# Patient Record
Sex: Male | Born: 1960
Health system: Southern US, Community
[De-identification: ages and names within clinical notes are randomized; demographics above are authoritative.]

## PROBLEM LIST (undated history)

## (undated) DIAGNOSIS — F419 Anxiety disorder, unspecified: Secondary | ICD-10-CM

## (undated) DIAGNOSIS — J189 Pneumonia, unspecified organism: Secondary | ICD-10-CM

## (undated) DIAGNOSIS — G4730 Sleep apnea, unspecified: Secondary | ICD-10-CM

## (undated) DIAGNOSIS — E785 Hyperlipidemia, unspecified: Secondary | ICD-10-CM

## (undated) DIAGNOSIS — I2510 Atherosclerotic heart disease of native coronary artery without angina pectoris: Secondary | ICD-10-CM

## (undated) DIAGNOSIS — S62612P Displaced fracture of proximal phalanx of right middle finger, subsequent encounter for fracture with malunion: Secondary | ICD-10-CM

## (undated) DIAGNOSIS — I10 Essential (primary) hypertension: Secondary | ICD-10-CM

## (undated) HISTORY — PX: FLEXIBLE SIGMOIDOSCOPY: SHX1649

## (undated) HISTORY — DX: Hyperlipidemia, unspecified: E78.5

## (undated) HISTORY — DX: Atherosclerotic heart disease of native coronary artery without angina pectoris: I25.10

## (undated) HISTORY — DX: Essential (primary) hypertension: I10

## (undated) HISTORY — PX: CORONARY ANGIOPLASTY WITH STENT PLACEMENT: SHX49

## (undated) HISTORY — DX: Anxiety disorder, unspecified: F41.9

---

## 1995-10-30 DIAGNOSIS — J189 Pneumonia, unspecified organism: Secondary | ICD-10-CM

## 1995-10-30 HISTORY — DX: Pneumonia, unspecified organism: J18.9

## 1995-10-30 HISTORY — PX: BILE DUCT STENT PLACEMENT: SHX1227

## 1995-10-30 HISTORY — PX: LAPAROSCOPIC CHOLECYSTECTOMY: SUR755

## 2006-09-09 ENCOUNTER — Emergency Department (HOSPITAL_COMMUNITY): Admission: EM | Admit: 2006-09-09 | Discharge: 2006-09-09 | Payer: Self-pay | Admitting: Emergency Medicine

## 2006-09-09 ENCOUNTER — Ambulatory Visit: Payer: Self-pay | Admitting: Cardiovascular Disease

## 2006-09-16 ENCOUNTER — Ambulatory Visit: Payer: Self-pay | Admitting: Cardiovascular Disease

## 2008-02-19 ENCOUNTER — Ambulatory Visit (HOSPITAL_COMMUNITY): Admission: RE | Admit: 2008-02-19 | Discharge: 2008-02-19 | Payer: Self-pay | Admitting: Family Medicine

## 2008-07-13 ENCOUNTER — Ambulatory Visit: Payer: Self-pay | Admitting: Cardiovascular Disease

## 2008-07-21 ENCOUNTER — Ambulatory Visit: Payer: Self-pay

## 2008-08-12 ENCOUNTER — Ambulatory Visit: Payer: Self-pay | Admitting: Cardiovascular Disease

## 2010-02-08 ENCOUNTER — Encounter: Payer: Self-pay | Admitting: Cardiovascular Disease

## 2010-02-08 ENCOUNTER — Ambulatory Visit (HOSPITAL_COMMUNITY): Admission: RE | Admit: 2010-02-08 | Discharge: 2010-02-08 | Payer: Self-pay | Admitting: Family Medicine

## 2010-02-09 ENCOUNTER — Ambulatory Visit: Payer: Self-pay | Admitting: Cardiovascular Disease

## 2010-02-09 ENCOUNTER — Encounter (INDEPENDENT_AMBULATORY_CARE_PROVIDER_SITE_OTHER): Payer: Self-pay | Admitting: *Deleted

## 2010-02-09 ENCOUNTER — Encounter: Payer: Self-pay | Admitting: Cardiovascular Disease

## 2010-02-09 DIAGNOSIS — R079 Chest pain, unspecified: Secondary | ICD-10-CM | POA: Insufficient documentation

## 2010-02-09 DIAGNOSIS — I10 Essential (primary) hypertension: Secondary | ICD-10-CM | POA: Insufficient documentation

## 2010-02-09 DIAGNOSIS — E785 Hyperlipidemia, unspecified: Secondary | ICD-10-CM | POA: Insufficient documentation

## 2010-02-09 DIAGNOSIS — R0602 Shortness of breath: Secondary | ICD-10-CM | POA: Insufficient documentation

## 2010-02-10 ENCOUNTER — Ambulatory Visit: Payer: Self-pay | Admitting: Cardiovascular Disease

## 2010-02-10 ENCOUNTER — Ambulatory Visit (HOSPITAL_COMMUNITY): Admission: RE | Admit: 2010-02-10 | Discharge: 2010-02-11 | Payer: Self-pay | Admitting: Cardiovascular Disease

## 2010-02-10 LAB — CONVERTED CEMR LAB
BUN: 13 mg/dL (ref 6–23)
Calcium: 9.7 mg/dL (ref 8.4–10.5)
Creatinine, Ser: 1.15 mg/dL (ref 0.40–1.50)
Eosinophils Absolute: 0.1 10*3/uL (ref 0.0–0.7)
Eosinophils Relative: 1 % (ref 0–5)
Glucose, Bld: 93 mg/dL (ref 70–99)
HCT: 43.1 % (ref 39.0–52.0)
INR: 0.96 (ref <1.50)
Lymphocytes Relative: 25 % (ref 12–46)
MCHC: 35.5 g/dL (ref 30.0–36.0)
Platelets: 213 10*3/uL (ref 150–400)
RBC: 4.71 M/uL (ref 4.22–5.81)
RDW: 11.9 % (ref 11.5–15.5)
Sodium: 139 meq/L (ref 135–145)
WBC: 6.6 10*3/uL (ref 4.0–10.5)
aPTT: 25 s (ref 24–37)

## 2010-02-28 ENCOUNTER — Observation Stay (HOSPITAL_COMMUNITY): Admission: EM | Admit: 2010-02-28 | Discharge: 2010-03-01 | Payer: Self-pay | Admitting: Emergency Medicine

## 2010-02-28 ENCOUNTER — Telehealth: Payer: Self-pay | Admitting: Cardiovascular Disease

## 2010-02-28 ENCOUNTER — Ambulatory Visit: Payer: Self-pay | Admitting: Cardiology

## 2010-03-08 ENCOUNTER — Ambulatory Visit: Payer: Self-pay | Admitting: Cardiovascular Disease

## 2010-06-26 ENCOUNTER — Telehealth: Payer: Self-pay | Admitting: Cardiovascular Disease

## 2010-08-30 ENCOUNTER — Ambulatory Visit: Payer: Self-pay | Admitting: Cardiovascular Disease

## 2010-10-02 ENCOUNTER — Encounter: Payer: Self-pay | Admitting: Cardiovascular Disease

## 2010-10-27 ENCOUNTER — Encounter: Payer: Self-pay | Admitting: Cardiovascular Disease

## 2010-10-27 ENCOUNTER — Ambulatory Visit: Payer: Self-pay | Admitting: Cardiovascular Disease

## 2010-11-19 ENCOUNTER — Emergency Department (HOSPITAL_COMMUNITY)
Admission: EM | Admit: 2010-11-19 | Discharge: 2010-11-19 | Payer: Self-pay | Source: Home / Self Care | Admitting: Emergency Medicine

## 2010-11-19 ENCOUNTER — Encounter: Payer: Self-pay | Admitting: Orthopedic Surgery

## 2010-11-20 ENCOUNTER — Ambulatory Visit
Admission: RE | Admit: 2010-11-20 | Discharge: 2010-11-20 | Payer: Self-pay | Source: Home / Self Care | Attending: Orthopedic Surgery | Admitting: Orthopedic Surgery

## 2010-11-20 ENCOUNTER — Encounter: Payer: Self-pay | Admitting: Orthopedic Surgery

## 2010-11-20 DIAGNOSIS — S62319A Displaced fracture of base of unspecified metacarpal bone, initial encounter for closed fracture: Secondary | ICD-10-CM | POA: Insufficient documentation

## 2010-11-26 LAB — CONVERTED CEMR LAB
CO2: 23 meq/L (ref 19–32)
Creatinine, Ser: 1.08 mg/dL (ref 0.40–1.50)
Glucose, Bld: 88 mg/dL (ref 70–99)
Potassium: 4 meq/L (ref 3.5–5.3)

## 2010-11-28 NOTE — Progress Notes (Signed)
Summary: refill  Phone Note Refill Request Message from:  Patient on June 26, 2010 3:16 PM  Refills Requested: Medication #1:  EFFIENT 10 MG TABS 1 tab by mouth once daily.  Medication #2:  METOPROLOL TARTRATE 25 MG TABS Take one tablet by mouth twice a day [facility]  Initial call taken by: Judie Grieve,  June 26, 2010 3:16 PM    Prescriptions: EFFIENT 10 MG TABS (PRASUGREL HCL) 1 tab by mouth once daily  #90 x 3   Entered by:   Kem Parkinson   Authorized by:   Colon Branch, MD, [facility]   Signed by:   Kem Parkinson on 06/26/2010   Method used:   Electronically to        [facility]* (retail)             ,          Ph: [phone]       Fax: [phone]   RxID:   [id number] METOPROLOL TARTRATE 25 MG TABS (METOPROLOL TARTRATE) Take one tablet by mouth twice a day  #180 x 3   Entered by:   Kem Parkinson   Authorized by:   Colon Branch, MD, [facility]   Signed by:   Kem Parkinson on 06/26/2010   Method used:   Electronically to        [facility]* (retail)             ,          Ph: [phone]       Fax: [phone]   RxID:   [id number]

## 2010-11-28 NOTE — Assessment & Plan Note (Signed)
Summary: per check out/sf   History of Present Illness: Jeff Prince is seen post hosp D/C  He had a DES to the prox LAD about 3 weeks ago.  He was readmitted with SSCP about a week after implantation and F/U cath showed a patent stent.  He has a significatn panic/anxiety disorder that needs further Rx.  I encouraged him to discuss referall  by Dr Regino Schultze.  He denies SSCP since 2nd D/C.  He has been compliant with his meds and needs his Tricor refilled through [facility].  He is on Effient and not having any bleeding problems.  All of his caths were done from the right radial with no complicaitons.  BP has been running a little high I would rather have him on ACE and Ca blocker and we will switch him over to Cozaar  Current Problems (verified): 1)  Essential Hypertension, Benign  (ICD-401.1) 2)  Chest Pain  (ICD-786.50) 3)  Dyspnea  (ICD-786.05) 4)  Hyperlipidemia  (ICD-272.4)  Current Medications (verified): 1)  Tricor 145 Mg Tabs (Fenofibrate) .... Take 1 Tab Daily 2)  Aspirin 81 Mg Tbec (Aspirin) .... Take One Tablet By Mouth Daily 3)  Daily Multi  Tabs (Multiple Vitamins-Minerals) .... Take 1 Tab Daily 4)  Vitamin E 100 Unit Caps (Vitamin E) .... Take 1 Tab Daily 5)  Vitamin C Cr 500 Mg Cr-Tabs (Ascorbic Acid) .... Take 1 Tab Daily 6)  Ra Fish Oil 1000 Mg Caps (Omega-3 Fatty Acids) .... Take 3 Caps Daily 7)  Simvastatin 20 Mg Tabs (Simvastatin) .... Take One Tablet By Mouth Daily At Bedtime 8)  Metoprolol Tartrate 25 Mg Tabs (Metoprolol Tartrate) .... Take One Tablet By Mouth Twice A Day 9)  Effient 10 Mg Tabs (Prasugrel Hcl) .Marland Kitchen.. 1 Tab By Mouth Once Daily  Allergies (verified): No Known Drug Allergies  Past History:  Past Medical History: Last updated: Mar 04, 2010 Current Problems:  CHEST PAIN (ICD-786.50) DYSPNEA (ICD-786.05) HYPERLIPIDEMIA (ICD-272.4)  Past Surgical History: Last updated: 03-04-10 Laparscopic cholecystectomy 1997  Family History: Last updated:  03-04-2010 Father:deceased age 24 lung cancer Mother:age 20 alive with diabetes Siblings:1 sister 2 brothers alive and well all have elevated triglycerides.  Social History: Last updated: 2010/03/04 Full Time Married  Tobacco Use - Yes.  Alcohol Use - yes Regular Exercise - no Drug Use - no  Review of Systems       Denies fever, malais, weight loss, blurry vision, decreased visual acuity, cough, sputum, SOB, hemoptysis, pleuritic pain, palpitaitons, heartburn, abdominal pain, melena, lower extremity edema, claudication, or rash.   Vital Signs:  Patient profile:   50 year old male Height:      72 inches Weight:      180 pounds BMI:     24.50 Pulse rate:   60 / minute Resp:     14 per minute BP sitting:   160 / 82  (left arm)  Vitals Entered By: Kem Parkinson (August 30, 2010 10:02 AM)  Physical Exam  General:  Affect appropriate Healthy:  appears stated age HEENT: normal Neck supple with no adenopathy JVP normal no bruits no thyromegaly Lungs clear with no wheezing and good diaphragmatic motion Heart:  S1/S2 no murmur,rub, gallop or click PMI normal Abdomen: benighn, BS positve, no tenderness, no AAA no bruit.  No HSM or HJR Distal pulses intact with no bruits No edema Neuro non-focal Skin warm and dry    Impression & Recommendations:  Problem # 1:  ESSENTIAL HYPERTENSION, BENIGN (ICD-401.1) Change to Cozaar Check  BMET in 4 weeks The following medications were removed from the medication list:    Norvasc 5 Mg Tabs (Amlodipine besylate) .Marland Kitchen... 1 tab by mouth once daily His updated medication list for this problem includes:    Aspirin 81 Mg Tbec (Aspirin) .Marland Kitchen... Take one tablet by mouth daily    Metoprolol Tartrate 25 Mg Tabs (Metoprolol tartrate) .Marland Kitchen... Take one tablet by mouth twice a day    Cozaar 50 Mg Tabs (Losartan potassium) .Marland Kitchen... 1qd  Problem # 2:  CHEST PAIN (ICD-786.50) CAD with stent LAD.  Recath patent  Anxiety component.  Continue medical  Rx The following medications were removed from the medication list:    Norvasc 5 Mg Tabs (Amlodipine besylate) .Marland Kitchen... 1 tab by mouth once daily His updated medication list for this problem includes:    Aspirin 81 Mg Tbec (Aspirin) .Marland Kitchen... Take one tablet by mouth daily    Metoprolol Tartrate 25 Mg Tabs (Metoprolol tartrate) .Marland Kitchen... Take one tablet by mouth twice a day    Effient 10 Mg Tabs (Prasugrel hcl) .Marland Kitchen... 1 tab by mouth once daily  Problem # 3:  HYPERLIPIDEMIA (ICD-272.4) Contineu 2 drug Rx labs in 3 months His updated medication list for this problem includes:    Tricor 145 Mg Tabs (Fenofibrate) .Marland Kitchen... Take 1 tab daily    Simvastatin 20 Mg Tabs (Simvastatin) .Marland Kitchen... Take one tablet by mouth daily at bedtime  Patient Instructions: 1)  Your physician recommends that you schedule a follow-up appointment in: 6-8 WEEKS WITH DR Eden Emms IN [place] IF POSSILBE 2)  Your physician recommends that you return for lab work in:4 WEEKS BMET V58.69 3)  Your physician has recommended you make the following change in your medication: STOP NORVASC(AMLODIPINE) 4)  START COZAAR 50 MG 1 once daily  Prescriptions: COZAAR 50 MG TABS (LOSARTAN POTASSIUM) 1QD  #7 x 0   Entered by:   Scherrie Bateman, LPN   Authorized by:   Colon Branch, MD, [facility]   Signed by:   Scherrie Bateman, LPN on 65/78/4696   Method used:   Electronically to        [facility]* (retail)       [address] [place], [place]  [address]       Ph: [phone]       Fax: [phone]   RxID:   [id number] COZAAR 50 MG TABS (LOSARTAN POTASSIUM) 1QD  #90 x 3   Entered by:   Scherrie Bateman, LPN   Authorized by:   Colon Branch, MD, [facility]   Signed by:   Scherrie Bateman, LPN on 29/51/8841   Method used:   Electronically to        [facility]* (retail)             ,          Ph: [phone]       Fax: [phone]   RxID:   [id number]

## 2010-11-28 NOTE — Miscellaneous (Signed)
Summary: Orders Update  Clinical Lists Changes  Orders: Added new Test order of T-Basic Metabolic Panel (80048-22910) - Signed Added new Test order of T-CBC w/Diff (85025-10010) - Signed Added new Test order of T-Protime, Auto (85610-22000) - Signed 

## 2010-11-28 NOTE — Progress Notes (Signed)
Summary: chest pains this morning burning in chest med making pt sick  Phone Note Call from Patient Call back at Home Phone [phone]   Caller: Mom Summary of Call: Pt had a stent placed about two weeks ago and pt is still having chest pains,  burning feelingin chest  and the mediction is making the pt sick  Initial call taken by: Judie Grieve,  Feb 28, 2010 9:02 AM  Follow-up for Phone Call        Patient had a cardiac cath with stent a couple of weeks ago.  Said he didn't have any symptoms the first week after the procedure.  Now, patient described having chest pain & L arm pain, a burning sensation in L chest, all for about the last week.   Also, he said he's been having severe anxiety and last night had pain in his jaw & neck for about an hour.  It eased up but didn't go away.  He said it was there for most of the night and he even had to get up during the night a couple of times because of it. Told him to go to the ED for evaluation.  He verbalized understanding.  Called cardmaster to alert her of his arrival. Follow-up by: Minerva Areola, RN, BSN,  Feb 28, 2010 9:18 AM

## 2010-11-28 NOTE — Letter (Signed)
Summary: Cardiac Catheterization Instructions- Main Lab  [facility], Main Office  [address]   [place], [place] [address]   Phone: [phone]  Fax: [phone]     02/09/2010 MRN: [id number]  Jeff Prince [address] [place], [place]  [address]  Dear Jeff Prince,   You are scheduled for Cardiac Catheterization on FRIDAY 02-10-10              with Dr. Eden Emms           .  Please arrive at the [facility] at  7:30     a.m. on the day of your procedure.  1. DIET     XXXXXXXXX____ Nothing to eat or drink after midnight except your medications with a sip of water.  2. Come to the [place] office on             for lab work.  The lab at [facility] is open from 8:30 a.m. to 1:30 p.m. and 2:30 p.m. to 5:00 p.m.  The lab at [facility] is open from 7:30 a.m. to 5:30 p.m.  You do not have to be fasting.  3. MAKE SURE YOU TAKE YOUR ASPIRIN.  4. _____ DO NOT TAKE these medications before your procedure:         ________________________________________________________________________________      ____ YOU MAY TAKE ALL of your remaining medications with a small amount of water.      ____ START NEW medications:     ________________________________________________________________________________      ____ Eilene Ghazi instructions:     ________________________________________________________________________________  5. Plan for one night stay - bring personal belongings (i.e. toothpaste, toothbrush, etc.)  6. Bring a current list of your medications and current insurance cards.  7. Must have a responsible person to drive you home.   8. Someone must be with yu for the first 24 hours after you arrive home.  9. Please wear clothes that are easy to get on and off and wear slip-on shoes.  *Special note: Every effort is made to have your procedure done on time.  Occasionally there are emergencies that present themselves  at the hospital that may cause delays.  Please be patient if a delay does occur.  If you have any questions after you get home, please call the office at the number listed above.  Deliah Goody, RN

## 2010-11-28 NOTE — Cardiovascular Report (Signed)
Summary: Pre Cath/Percutaneous Orders  Pre Cath/Percutaneous Orders   Imported By: Roderic Ovens 02/16/2010 10:46:15  _____________________________________________________________________  External Attachment:    Type:   Image     Comment:   External Document

## 2010-11-28 NOTE — Assessment & Plan Note (Signed)
Summary: 1 MONTH ROV   History of Present Illness: Jeff Prince is seen post hosp D/C  He had a DES to the prox LAD about 3 weeks ago.  He was readmitted with SSCP about a week after implantation and F/U cath showed a patent stent.  He has a significatn panic/anxiety disorder that needs further Rx.  I encouraged him to discuss referall  by Dr Regino Schultze.  He denies SSCP since 2nd D/C.  He has been compliant with his meds and needs his Tricor refilled through [facility].  He is on Effient and not having any bleeding problems.  All of his caths were done from the right radial with no complicaitons.  Current Problems (verified): 1)  Essential Hypertension, Benign  (ICD-401.1) 2)  Chest Pain  (ICD-786.50) 3)  Dyspnea  (ICD-786.05) 4)  Hyperlipidemia  (ICD-272.4)  Current Medications (verified): 1)  Tricor 145 Mg Tabs (Fenofibrate) .... Take 1 Tab Daily 2)  Aspirin 81 Mg Tbec (Aspirin) .... Take One Tablet By Mouth Daily 3)  Daily Multi  Tabs (Multiple Vitamins-Minerals) .... Take 1 Tab Daily 4)  Vitamin E 100 Unit Caps (Vitamin E) .... Take 1 Tab Daily 5)  Vitamin C Cr 500 Mg Cr-Tabs (Ascorbic Acid) .... Take 1 Tab Daily 6)  Ra Fish Oil 1000 Mg Caps (Omega-3 Fatty Acids) .... Take2 Caps Daily 7)  Simvastatin 20 Mg Tabs (Simvastatin) .... Take One Tablet By Mouth Daily At Bedtime 8)  Norvasc 5 Mg Tabs (Amlodipine Besylate) .Marland Kitchen.. 1 Tab By Mouth Once Daily 9)  Metoprolol Tartrate 25 Mg Tabs (Metoprolol Tartrate) .... Take One Tablet By Mouth Twice A Day 10)  Effient 10 Mg Tabs (Prasugrel Hcl) .Marland Kitchen.. 1 Tab By Mouth Once Daily  Allergies (verified): No Known Drug Allergies  Past History:  Past Medical History: Last updated: 15-Feb-2010 Current Problems:  CHEST PAIN (ICD-786.50) DYSPNEA (ICD-786.05) HYPERLIPIDEMIA (ICD-272.4)  Past Surgical History: Last updated: 02/15/10 Laparscopic cholecystectomy 1997  Family History: Last updated: Feb 15, 2010 Father:deceased age 91 lung cancer Mother:age 55 alive  with diabetes Siblings:1 sister 2 brothers alive and well all have elevated triglycerides.  Social History: Last updated: 02/15/10 Full Time Married  Tobacco Use - Yes.  Alcohol Use - yes Regular Exercise - no Drug Use - no  Review of Systems       Denies fever, malais, weight loss, blurry vision, decreased visual acuity, cough, sputum, SOB, hemoptysis, pleuritic pain, palpitaitons, heartburn, abdominal pain, melena, lower extremity edema, claudication, or rash.   Vital Signs:  Patient profile:   50 year old male Height:      72 inches Weight:      180 pounds BMI:     24.50 Pulse rate:   54 / minute Resp:     14 per minute BP sitting:   141 / 86  (left arm)  Vitals Entered By: Kem Parkinson (Mar 08, 2010 9:14 AM)  Physical Exam  General:  Affect appropriate Healthy:  appears stated age HEENT: normal Neck supple with no adenopathy JVP normal no bruits no thyromegaly Lungs clear with no wheezing and good diaphragmatic motion Heart:  S1/S2 no murmur,rub, gallop or click PMI normal Abdomen: benighn, BS positve, no tenderness, no AAA no bruit.  No HSM or HJR Distal pulses intact with no bruits No edema Neuro non-focal Skin warm and dry Right radial patent    Impression & Recommendations:  Problem # 1:  ESSENTIAL HYPERTENSION, BENIGN (ICD-401.1) Well controlled continue norvasc His updated medication list for this problem includes:  Aspirin 81 Mg Tbec (Aspirin) .Marland Kitchen... Take one tablet by mouth daily    Norvasc 5 Mg Tabs (Amlodipine besylate) .Marland Kitchen... 1 tab by mouth once daily    Metoprolol Tartrate 25 Mg Tabs (Metoprolol tartrate) .Marland Kitchen... Take one tablet by mouth twice a day  Problem # 2:  CHEST PAIN (ICD-786.50) Stent to proximal LAD patent on relook.  Continue a Effient His updated medication list for this problem includes:    Aspirin 81 Mg Tbec (Aspirin) .Marland Kitchen... Take one tablet by mouth daily    Norvasc 5 Mg Tabs (Amlodipine besylate) .Marland Kitchen... 1 tab by mouth once  daily    Metoprolol Tartrate 25 Mg Tabs (Metoprolol tartrate) .Marland Kitchen... Take one tablet by mouth twice a day    Effient 10 Mg Tabs (Prasugrel hcl) .Marland Kitchen... 1 tab by mouth once daily  Problem # 3:  HYPERLIPIDEMIA (ICD-272.4) F/U labs in 6 months  Refill called in His updated medication list for this problem includes:    Tricor 145 Mg Tabs (Fenofibrate) .Marland Kitchen... Take 1 tab daily    Simvastatin 20 Mg Tabs (Simvastatin) .Marland Kitchen... Take one tablet by mouth daily at bedtime  Patient Instructions: 1)  Your physician recommends that you schedule a follow-up appointment in: 6 MONTHS WITH DR. Eden Emms IN [place]  2)  Your physician recommends that you continue on your current medications as directed. Please refer to the Current Medication list given to you today.

## 2010-11-28 NOTE — Assessment & Plan Note (Signed)
Summary: np3/dm   CC:  pt complains of chest pain , pt states his arm gets numb and fingers tingle, and pt states he notices the pain when his heart rate is up. Severity of pain 7 .  History of Present Illness: Jeff Prince is seen today as an urgent add-on per Dr Regino Schultze.  He was supposed to see Dr Dietrich Pates in [place] but drove to [place].  I have seen him for SSCP in 2009 and he had a normal myovue.  He has some anxiety and carries xanax with him.  In the last two weeks he's had worrisome SSCP with exertion.  It is always related to elevated heart rate, and radiates to the left arm with tingling.  When he stops exercising and his HR goes done his pain eases.  He has not had any rest episodes.  ECG at Dr Edison Simon office and here is normal.  He had another bad episode on Tuesday having sex with his wife and had to stop.  His CRF's include elevated lipids and HTN.  He does not appear particularly anxious and is working full time at [employer].  Given his crescendo pattern of anginal sounding pain I recommended a heart cath in the morning to him  The risks including stroke were discussed  The alternative approach from the wrist was also discussed and his Allens test in the right wrist was normal.  He will have labs and CXR today.  I prefer to load him with Effient in the lab if he has disease.  Current Problems (verified): 1)  Chest Pain  (ICD-786.50) 2)  Dyspnea  (ICD-786.05) 3)  Hyperlipidemia  (ICD-272.4)  Current Medications (verified): 1)  Tricor 145 Mg Tabs (Fenofibrate) .... Take 1 Tab Daily 2)  Aspirin 81 Mg Tbec (Aspirin) .... Take One Tablet By Mouth Daily 3)  Daily Multi  Tabs (Multiple Vitamins-Minerals) .... Take 1 Tab Daily 4)  Vitamin E 100 Unit Caps (Vitamin E) .... Take 1 Tab Daily 5)  Vitamin C Cr 500 Mg Cr-Tabs (Ascorbic Acid) .... Take 1 Tab Daily 6)  Ra Fish Oil 1000 Mg Caps (Omega-3 Fatty Acids) .... Take2 Caps Daily 7)  Simvastatin 20 Mg Tabs (Simvastatin) .... Take One Tablet By  Mouth Daily At Bedtime 8)  Norvasc .Marland Kitchen.. 1 Tab Po Once Daily  Allergies (verified): No Known Drug Allergies  Past History:  Past Medical History: Last updated: Feb 11, 2010 Current Problems:  CHEST PAIN (ICD-786.50) DYSPNEA (ICD-786.05) HYPERLIPIDEMIA (ICD-272.4)  Past Surgical History: Last updated: 02-11-10 Laparscopic cholecystectomy 1997  Family History: Last updated: 02/11/2010 Father:deceased age 74 lung cancer Mother:age 39 alive with diabetes Siblings:1 sister 2 brothers alive and well all have elevated triglycerides.  Social History: Last updated: 2010-02-11 Full Time Married  Tobacco Use - Yes.  Alcohol Use - yes Regular Exercise - no Drug Use - no  Review of Systems       Denies fever, malais, weight loss, blurry vision, decreased visual acuity, cough, sputum, SOB, hemoptysis, pleuritic pain, palpitaitons, heartburn, abdominal pain, melena, lower extremity edema, claudication, or rash.   Vital Signs:  Patient profile:   50 year old male Height:      72 inches Weight:      180 pounds BMI:     24.50 Pulse rate:   73 / minute Resp:     14 per minute BP sitting:   134 / 100  (left arm)  Vitals Entered By: Kem Parkinson (02/11/10 3:33 PM)  Physical Exam  General:  Affect appropriate Healthy:  appears stated age HEENT: normal Neck supple with no adenopathy JVP normal no bruits no thyromegaly Lungs clear with no wheezing and good diaphragmatic motion Heart:  S1/S2 no murmur,rub, gallop or click PMI normal Abdomen: benighn, BS positve, no tenderness, no AAA no bruit.  No HSM or HJR Distal pulses intact with no bruits No edema Neuro non-focal Skin warm and dry Mild pain to palpation chest but not shoulder or arm   Impression & Recommendations:  Problem # 1:  CHEST PAIN (ICD-786.50) Worisome for angina Cath in a.m. CXR normal and labs reviewed and normal His updated medication list for this problem includes:    Aspirin 81 Mg Tbec  (Aspirin) .Marland Kitchen... Take one tablet by mouth daily  Orders: T-2 View CXR (71020TC) Cardiac Catheterization (Cardiac Cath)  Problem # 2:  HYPERLIPIDEMIA (ICD-272.4) Labs per Dr Regino Schultze.  Target LDL will depend on presence or absence of CAD on cath His updated medication list for this problem includes:    Tricor 145 Mg Tabs (Fenofibrate) .Marland Kitchen... Take 1 tab daily    Simvastatin 20 Mg Tabs (Simvastatin) .Marland Kitchen... Take one tablet by mouth daily at bedtime  Problem # 3:  ESSENTIAL HYPERTENSION, BENIGN (ICD-401.1) Well controlled.  Low sodium diet His updated medication list for this problem includes:    Aspirin 81 Mg Tbec (Aspirin) .Marland Kitchen... Take one tablet by mouth daily  Patient Instructions: 1)  Your physician recommends that you schedule a follow-up appointment in: 4 WEEKS 2)  Your physician has requested that you have a cardiac catheterization.  Cardiac catheterization is used to diagnose and/or treat various heart conditions. Doctors may recommend this procedure for a number of different reasons. The most common reason is to evaluate chest pain. Chest pain can be a symptom of coronary artery disease (CAD), and cardiac catheterization can show whether plaque is narrowing or blocking your heart's arteries. This procedure is also used to evaluate the valves, as well as measure the blood flow and oxygen levels in different parts of your heart.  For further information please visit [email or web address]  Please follow instruction sheet, as given.   EKG Report  Procedure date:  02/09/2010  Findings:      NSR 73 Normal ECG

## 2010-11-28 NOTE — Letter (Signed)
Summary: [facility] Office Note  [facility] Office Note   Imported By: Roderic Ovens 03/01/2010 11:38:00  _____________________________________________________________________  External Attachment:    Type:   Image     Comment:   External Document

## 2010-11-30 NOTE — Assessment & Plan Note (Signed)
Summary: AP ER FOL/UP/FX RT METACARPAL/XR AP 11/19/10/BCBS/CAF   Vital Signs:  Patient profile:   50 year old male Height:      72 inches Weight:      180 pounds Pulse rate:   78 / minute Resp:     16 per minute  Vitals Entered By: Fuller Canada MD (November 20, 2010 3:01 PM)  Visit Type:  new patient Referring Provider:  ap er Primary Provider:  Dr. Regino Schultze  CC:  right hand.  History of Present Illness: I saw Jeff Prince in the office today for an initial visit.  He is a 50 years old man with the complaint of:  right hand fracture.  DOI 11/18/10.  Xrays [facility] 11/19/10.  Meds: Tricor, Zocor, ASA, Effient, Losartan, Norco 5 given for pain from er, number 20.  Complaint  RIGHT hand fracture.  Pain is described as throbbing, pain on a  scale of 7/10, constant, unrelieved by Norco and splinting. He is on Norco 5 mg.  Patient injured his hand when his RIGHT hand slipped off a Tuli at the back of a hard object and has a minimally displaced minimally angulated fracture of the base of the metacarpal of his hand.  also has quite a bit of dorsal bruising and swelling.      Allergies (verified): No Known Drug Allergies  Past History:  Past Medical History: Current Problems:  CHEST PAIN (ICD-786.50) DYSPNEA (ICD-786.05) HYPERLIPIDEMIA (ICD-272.4) HTN  Past Surgical History: Laparscopic cholecystectomy 1997 stent in heart  Social History: Full Time textiles Married  Tobacco Use - no Alcohol Use - yes Regular Exercise - no Drug Use - no some college  Review of Systems Constitutional:  Denies weight loss, weight gain, fever, chills, and fatigue. Cardiovascular:  Denies chest pain, palpitations, fainting, and murmurs. Respiratory:  Denies short of breath, wheezing, couch, tightness, pain on inspiration, and snoring . Gastrointestinal:  Denies heartburn, nausea, vomiting, diarrhea, constipation, and blood in your stools. Genitourinary:  Denies frequency, urgency,  difficulty urinating, painful urination, flank pain, and bleeding in urine. Neurologic:  Denies numbness, tingling, unsteady gait, dizziness, tremors, and seizure. Musculoskeletal:  Denies joint pain, swelling, instability, stiffness, redness, heat, and muscle pain. Endocrine:  Denies excessive thirst, exessive urination, and heat or cold intolerance. Psychiatric:  Complains of anxiety; denies nervousness, depression, and hallucinations. Skin:  Denies changes in the skin, poor healing, rash, itching, and redness. HEENT:  Denies blurred or double vision, eye pain, redness, and watering. Immunology:  Denies seasonal allergies, sinus problems, and allergic to bee stings. Hemoatologic:  Complains of easy bleeding and brusing.  Physical Exam  Additional Exam:  GEN: well developed, well nourished, normal grooming and hygiene, no deformity and normal body habitus.   CDV: pulses are normal, no edema, no erythema. no tenderness  Lymph: normal lymph nodes   Skin: no rashes, skin lesions or open sores   NEURO: normal coordination, reflexes, sensation.   Psyche: awake, alert and oriented. Mood normal   Gait: normal  Right-hand dorsal swelling and ecchymosis of the subcutaneous tissue. No rotatory malalignment noted. Passive range of motion painful.  Muscle tone normal.  Hand and wrist joint are stable     Impression & Recommendations:  Problem # 1:  CLOSED FRACTURE OF BASE OF OTHER METACARPAL BONE (ICD-815.02) Assessment New  The x-rays were done at [facility]. The report and the films have been reviewed. Metacarpal fracture, RIGHT hand, minimally angulated minimally displaced  Orders: New Patient Level III (84696) Metacarpal Fx (29528)  Medications Added to Medication List This Visit: 1)  Norco 7.5-325 Mg Tabs (Hydrocodone-acetaminophen) .Marland Kitchen.. 1 by mouth q 4 as needed pain  Patient Instructions: 1)  WED or THURS FEB 15/16 XR OOP  2)  Please do not get the cast wet. It will casue a  severe skin reaction. If you do get it wet, dry it with a hairdryer on a low setting and call the office. [the cast will need to be changed]  Prescriptions: NORCO 7.5-325 MG TABS (HYDROCODONE-ACETAMINOPHEN) 1 by mouth q 4 as needed pain  #84 x 2   Entered and Authorized by:   Fuller Canada MD   Signed by:   Fuller Canada MD on 11/20/2010   Method used:   Print then Give to Patient   RxID:   [id number]    Orders Added: 1)  New Patient Level III [14782] 2)  Metacarpal Fx [26600]

## 2010-11-30 NOTE — Letter (Signed)
Summary: History form  History form   Imported By: Jacklynn Ganong 11/21/2010 13:36:15  _____________________________________________________________________  External Attachment:    Type:   Image     Comment:   External Document

## 2010-11-30 NOTE — Assessment & Plan Note (Signed)
Summary: PER CHECK OUT/SF   CC:  chest pain.  History of Present Illness: Jeff Prince is seen post hosp D/C  He had a DES to the prox LAD about 10/11.  He was readmitted with SSCP about a week after implantation and F/U cath showed a patent stent.  He has a significatn panic/anxiety disorder that needs further Rx.  I encouraged him to discuss referall  by Dr Regino Schultze.  He denies SSCP since 2nd D/C.  He has been compliant with his meds and needs his Tricor refilled through [facility].  He is on Effient and not having any bleeding problems.  All of his caths were done from the right radial with no complicaitons.  BP has been running a little high I  and last visit he was put on Cozaar  Gets occasonal rapid palpitations.  Has not been on BB due to fatigue.  Apparantly TC at work when checked was 199.  History of generalized myalgias and focal left arm soreness.  Discussed increasing Zocor to 40 mg  Current Problems (verified): 1)  Essential Hypertension, Benign  (ICD-401.1) 2)  Chest Pain  (ICD-786.50) 3)  Dyspnea  (ICD-786.05) 4)  Hyperlipidemia  (ICD-272.4)  Current Medications (verified): 1)  Tricor 145 Mg Tabs (Fenofibrate) .... Take 1 Tab Daily 2)  Aspirin 81 Mg Tbec (Aspirin) .... Take One Tablet By Mouth Daily 3)  Daily Multi  Tabs (Multiple Vitamins-Minerals) .... Take 1 Tab Daily 4)  Vitamin E 100 Unit Caps (Vitamin E) .... Take 1 Tab Daily 5)  Vitamin C Cr 500 Mg Cr-Tabs (Ascorbic Acid) .... Take 1 Tab Daily 6)  Ra Fish Oil 1000 Mg Caps (Omega-3 Fatty Acids) .... Take 3 Caps Daily 7)  Simvastatin 20 Mg Tabs (Simvastatin) .... Take One Tablet By Mouth Daily At Bedtime 8)  Lopressor 50 Mg Tabs (Metoprolol Tartrate) .Marland Kitchen.. 1 Tab By Mouth Once Daily 9)  Effient 10 Mg Tabs (Prasugrel Hcl) .Marland Kitchen.. 1 Tab By Mouth Once Daily 10)  Cozaar 50 Mg Tabs (Losartan Potassium) .Marland Kitchen.. 1qd  Allergies (verified): No Known Drug Allergies  Past History:  Past Medical History: Last updated: 2010/03/02 Current  Problems:  CHEST PAIN (ICD-786.50) DYSPNEA (ICD-786.05) HYPERLIPIDEMIA (ICD-272.4)  Past Surgical History: Last updated: 03/02/10 Laparscopic cholecystectomy 1997  Family History: Last updated: 03/02/10 Father:deceased age 74 lung cancer Mother:age 5 alive with diabetes Siblings:1 sister 2 brothers alive and well all have elevated triglycerides.  Social History: Last updated: Mar 02, 2010 Full Time Married  Tobacco Use - Yes.  Alcohol Use - yes Regular Exercise - no Drug Use - no  Review of Systems       Denies fever, malais, weight loss, blurry vision, decreased visual acuity, cough, sputum, SOB, hemoptysis, pleuritic pain, heartburn, abdominal pain, melena, lower extremity edema, claudication, or rash.   Vital Signs:  Patient profile:   50 year old male Height:      72 inches Weight:      181 pounds BMI:     24.64 Pulse rate:   78 / minute Resp:     14 per minute BP sitting:   128 / 85  (left arm)  Vitals Entered By: Kem Parkinson (October 27, 2010 8:55 AM)  Physical Exam  General:  Affect appropriate Healthy:  appears stated age HEENT: normal Neck supple with no adenopathy JVP normal no bruits no thyromegaly Lungs clear with no wheezing and good diaphragmatic motion Heart:  S1/S2 no murmur,rub, gallop or click PMI normal Abdomen: benighn, BS positve, no tenderness, no AAA  no bruit.  No HSM or HJR Distal pulses intact with no bruits No edema Neuro non-focal Skin warm and dry    Impression & Recommendations:  Problem # 1:  ESSENTIAL HYPERTENSION, BENIGN (ICD-401.1) Improved with Cozaar His updated medication list for this problem includes:    Aspirin 81 Mg Tbec (Aspirin) .Marland Kitchen... Take one tablet by mouth daily    Lopressor 50 Mg Tabs (Metoprolol tartrate) .Marland Kitchen... 1 tab by mouth once daily    Cozaar 50 Mg Tabs (Losartan potassium) .Marland Kitchen... 1qd  Problem # 2:  CHEST PAIN (ICD-786.50) S/P stent LAD patent on relook cath.  Continue Effient until at  least 10/12 His updated medication list for this problem includes:    Aspirin 81 Mg Tbec (Aspirin) .Marland Kitchen... Take one tablet by mouth daily    Lopressor 50 Mg Tabs (Metoprolol tartrate) .Marland Kitchen... 1 tab by mouth once daily    Effient 10 Mg Tabs (Prasugrel hcl) .Marland Kitchen... 1 tab by mouth once daily  Problem # 3:  HYPERLIPIDEMIA (ICD-272.4) Increase simvastatin and check labs in 3 months His updated medication list for this problem includes:    Tricor 145 Mg Tabs (Fenofibrate) .Marland Kitchen... Take 1 tab daily    Simvastatin 40 Mg Tabs (Simvastatin) .Marland Kitchen... Take one tablet by mouth daily at bedtime  Patient Instructions: 1)  Your physician recommends that you schedule a follow-up appointment in: 3 MONTHS 2)  Your physician has recommended you make the following change in your medication: INCREASE SIMVASTATIN TO 40MG  DAILY Prescriptions: SIMVASTATIN 40 MG TABS (SIMVASTATIN) Take one tablet by mouth daily at bedtime  #90 x 3   Entered by:   Deliah Goody, RN   Authorized by:   Colon Branch, MD, [facility]   Signed by:   Deliah Goody, RN on 10/27/2010   Method used:   Electronically to        [facility]* (retail)             ,          Ph: [phone]       Fax: [phone]   RxID:   [id number]

## 2010-12-14 ENCOUNTER — Ambulatory Visit (INDEPENDENT_AMBULATORY_CARE_PROVIDER_SITE_OTHER): Payer: BC Managed Care – PPO | Admitting: Orthopedic Surgery

## 2010-12-14 ENCOUNTER — Encounter: Payer: Self-pay | Admitting: Orthopedic Surgery

## 2010-12-14 DIAGNOSIS — S62319A Displaced fracture of base of unspecified metacarpal bone, initial encounter for closed fracture: Secondary | ICD-10-CM

## 2010-12-20 NOTE — Assessment & Plan Note (Signed)
Summary: XR OOP RT HAND/DOI 11/17/10/BCBS/BSF   Visit Type:  Follow-up Referring Provider:  ap er Primary Provider:  Dr. Regino Schultze  CC:  right hand fracture.  History of Present Illness: 50 year old male, status post 4th metacarpal fracture, treated with short arm cast.   Date of injury November 18, 2010  right hand fracture.Problem # 1:  CLOSED FRACTURE OF BASE OF OTHER METACARPAL BONE (ICD-815.02)   4th MTC base  Assessment New  The [facility]-rays were done at [facility]. The report and the films have been reviewed. Metacarpal fracture, RIGHT hand, minimally angulated minimally displaced  Orders: New Patient Level III (16109) Metacarpal Fx (60454)  Medications Added to Medication List This Visit: 1)  Norco 7.5-325 Mg Tabs (Hydrocodone-acetaminophen) .Marland Kitchen.. 1 by mouth q 4 as needed pain  Meds: Tricor, Zocor, ASA, Effient, Losartan, Norco 7.5 mg.  Today xray OOP.  Complaint: none.  Allergies: No Known Drug Allergies  Physical Exam  Additional Exam:  he does have a non-on the back of his hand. However, he has no angulatory or alignment abnormality. His fracture is nontender. His grip strength is a little bit weak and his motion is a little bit less than normal, but this is minimal.     Impression & Recommendations:  Problem # 1:  CLOSED FRACTURE OF BASE OF OTHER METACARPAL BONE (ICD-815.02) Assessment Improved  Orders: Post-Op Check (09811) Hand x-ray, minimum 3 views (91478)  Patient Instructions: 1)  Please schedule a follow-up appointment as needed.   Orders Added: 1)  Post-Op Check [99024] 2)  Hand x-ray, minimum 3 views [73130]  Appended Document: XR OOP RT HAND/DOI 11/17/10/BCBS/BSF Separate and Identifiable X-Ray report      3 views of the RIGHT hand out of plaster.  4th metacarpal fracture at the base of minimal displacement some dorsal angulation callus formation noted.  Impression healed/healing 4th metacarpal fracture

## 2011-01-06 ENCOUNTER — Encounter: Payer: Self-pay | Admitting: Cardiovascular Disease

## 2011-01-16 LAB — CARDIAC PANEL(CRET KIN+CKTOT+MB+TROPI)
CK, MB: 1.4 ng/mL (ref 0.3–4.0)
CK, MB: 1.7 ng/mL (ref 0.3–4.0)
Relative Index: 0.8 (ref 0.0–2.5)
Relative Index: 1 (ref 0.0–2.5)
Total CK: 169 U/L (ref 7–232)
Total CK: 172 U/L (ref 7–232)
Troponin I: 0.01 ng/mL (ref 0.00–0.06)
Troponin I: 0.01 ng/mL (ref 0.00–0.06)

## 2011-01-16 LAB — CBC
MCHC: 35.7 g/dL (ref 30.0–36.0)
MCHC: 35.4 g/dL (ref 30.0–36.0)
MCV: 90.2 fL (ref 78.0–100.0)
Platelets: 218 10*3/uL (ref 150–400)
RBC: 4.53 MIL/uL (ref 4.22–5.81)
RBC: 4.5 MIL/uL (ref 4.22–5.81)

## 2011-01-16 LAB — BASIC METABOLIC PANEL
CO2: 26 mEq/L (ref 19–32)
CO2: 24 mEq/L (ref 19–32)
Calcium: 9.1 mg/dL (ref 8.4–10.5)
Calcium: 9.5 mg/dL (ref 8.4–10.5)
Chloride: 109 mEq/L (ref 96–112)
Creatinine, Ser: 1.23 mg/dL (ref 0.4–1.5)
GFR calc Af Amer: >60 mL/min (ref >60)
GFR calc Af Amer: >60 mL/min (ref >60)
GFR calc non Af Amer: >60 mL/min (ref >60)
GFR calc non Af Amer: >60 mL/min (ref >60)
Potassium: 3.5 mEq/L (ref 3.5–5.1)
Sodium: 137 mEq/L (ref 135–145)

## 2011-01-16 LAB — DIFFERENTIAL
Eosinophils Relative: 1 % (ref 0–5)
Lymphocytes Relative: 26 % (ref 12–46)
Lymphs Abs: 1.5 10*3/uL (ref 0.7–4.0)

## 2011-01-16 LAB — POCT CARDIAC MARKERS
CKMB, poc: 1.3 ng/mL (ref 1.0–8.0)
Troponin i, poc: <0.05 ng/mL (ref 0.00–0.09)
Troponin i, poc: <0.05 ng/mL (ref 0.00–0.09)

## 2011-01-16 LAB — POCT I-STAT, CHEM 8
Calcium, Ion: 1.21 mmol/L (ref 1.12–1.32)
Glucose, Bld: 106 mg/dL — ABNORMAL HIGH (ref 70–99)
Potassium: 4.4 mEq/L (ref 3.5–5.1)
Sodium: 140 mEq/L (ref 135–145)
TCO2: 28 mmol/L (ref 0–100)

## 2011-01-16 LAB — PROTIME-INR: Prothrombin Time: 12.8 seconds (ref 11.6–15.2)

## 2011-01-22 ENCOUNTER — Encounter: Payer: Self-pay | Admitting: Cardiovascular Disease

## 2011-01-22 ENCOUNTER — Ambulatory Visit (INDEPENDENT_AMBULATORY_CARE_PROVIDER_SITE_OTHER): Payer: BC Managed Care – PPO | Admitting: Cardiovascular Disease

## 2011-01-22 DIAGNOSIS — E785 Hyperlipidemia, unspecified: Secondary | ICD-10-CM

## 2011-01-22 DIAGNOSIS — R079 Chest pain, unspecified: Secondary | ICD-10-CM

## 2011-01-22 DIAGNOSIS — I2510 Atherosclerotic heart disease of native coronary artery without angina pectoris: Secondary | ICD-10-CM

## 2011-01-22 DIAGNOSIS — I10 Essential (primary) hypertension: Secondary | ICD-10-CM

## 2011-01-22 NOTE — Progress Notes (Signed)
Jeff Prince is seen post hosp D/C  He had a DES to the prox LAD about 10/11.  He was readmitted with SSCP about a week after implantation and F/U cath showed a patent stent.  He has a significatn panic/anxiety disorder that needs further Rx.  I encouraged him to discuss referall  by Dr Regino Schultze.  He denies SSCP since 2nd D/C.  He has been compliant with his meds and needs his Tricor refilled through [facility].  He is on Effient and not having any bleeding problems.  All of his caths were done from the right radial with no complicaitons.  BP has been running a little high I  and last visit he was put on Cozaar  Gets occasonal rapid palpitations.  Has not been on BB due to fatigue.  Apparantly TC at work when checked was 199.  History of generalized myalgias and focal left arm soreness.    ROS: Denies fever, malais, weight loss, blurry vision, decreased visual acuity, cough, sputum, SOB, hemoptysis, pleuritic pain, palpitaitons, heartburn, abdominal pain, melena, lower extremity edema, claudication, or rash.   General: Affect appropriate Healthy:  appears stated age HEENT: normal Neck supple with no adenopathy JVP normal no bruits no thyromegaly Lungs clear with no wheezing and good diaphragmatic motion Heart:  S1/S2 no murmur,rub, gallop or click PMI normal Abdomen: benighn, BS positve, no tenderness, no AAA no bruit.  No HSM or HJR Distal pulses intact with no bruits No edema Neuro non-focal Skin warm and dry No muscular weakness   Current Outpatient Prescriptions  Medication Sig Dispense Refill  . Ascorbic Acid (VITAMIN C) 500 MG tablet Take 500 mg by mouth daily.        Marland Kitchen aspirin 81 MG tablet Take 81 mg by mouth daily.        . fenofibrate (TRICOR) 145 MG tablet Take 145 mg by mouth daily.        . fish oil-omega-3 fatty acids 1000 MG capsule Take 3 g by mouth daily.        Marland Kitchen HYDROcodone-acetaminophen (NORCO) 7.5-325 MG per tablet Take 1 tablet by mouth every 4 (four) hours as needed.        Marland Kitchen  losartan (COZAAR) 50 MG tablet Take 50 mg by mouth daily.        . metoprolol (LOPRESSOR) 50 MG tablet Take 50 mg by mouth daily.        . Multiple Vitamin (MULTIVITAMIN) tablet Take 1 tablet by mouth daily.        . prasugrel (EFFIENT) 10 MG TABS Take 10 mg by mouth daily.        . simvastatin (ZOCOR) 40 MG tablet Take 40 mg by mouth at bedtime.        . vitamin E 100 UNIT capsule Take 200 Units by mouth daily.         Allergies  Review of patient's allergies indicates no known allergies.  Electrocardiogram:  NSR 74  Normal ECG  Assessment and Plan

## 2011-01-22 NOTE — Assessment & Plan Note (Signed)
CAD with stent to LAD 02/13/10  Patent by cath a month later.  Continue medical Rx

## 2011-01-22 NOTE — Assessment & Plan Note (Signed)
Cholesterol is at goal.  Continue current dose of statin and diet Rx.  No myalgias or side effects.  F/U  LFT's in 6 months. Lab Results  Component Value Date   LDLCALC 97 12/21/2010

## 2011-01-22 NOTE — Assessment & Plan Note (Signed)
Well controlled.  Continue current medications and low sodium Dash type diet.    

## 2011-01-22 NOTE — Patient Instructions (Signed)
Continue current meds.  F/U Dr Eden Emms 6 months

## 2011-03-13 NOTE — Assessment & Plan Note (Signed)
[facility]                            CARDIOLOGY OFFICE NOTE   NAME:Jeff Prince, Jeff Prince                       MRN:          [id number]  DATE:07/13/2008                            DOB:          16-May-1961    Jeff Prince returns today for followup.  Actually, Jeff Prince is seen  today at the request of Dr. Regino Schultze.  We have seen him a couple of years  ago for somewhat atypical chest pain.   In November 2007, he had a normal cardiac CT with a calcium score of 0.  About 3 weeks ago, he had been started having chest pain.  Most of the  initial episodes occurred at night that woke him up from sleep.  He had  shortness of breath, palpitations, and some paresthesias in his left  hand.  After about 2 days of this, he saw Dr. Sherwood Gambler in the office who  did an EKG and felt that was normal.  The patient was started on  Wellbutrin for question of anxiety and depression, this made him  nauseated.  He subsequently was seen in the emergency room, ruled out  for myocardial infarction.   The patient is now referred here for further workup.  The patient  continues to have episodes about a week ago.  He had a bad episode after  working out.  He was able to run 3 miles on the treadmill, but after  working out at [employer], he had severe shortness of breath, palpitations,  and chest pain.  He took a Xanax.  It did not help immediately.   In talking to the patient, he does not think he is particularly  depressed.  He is under a lot of stress at work, but this is chronic.  He has not taken any stimulants.  He has not had any other recurrent  medical problems.   He feels that his symptoms are progressive.  They are associated with  rapid palpitations.   REVIEW OF SYSTEMS:  Remarkable for significant hypercholesterolemia and  hypertriglyceridemia.  He seems hesitant to take medications.  He really  does not want to be on a statin drug.  His triglycerides used to be in  excess of  3000 and he says now they are close to 150 on TriCor, although  he is not particularly good about his diet all the time.   PAST MEDICAL HISTORY:  Otherwise remarkable for cholecystectomy, history  of atypical chest pain, question anxiety and depression.   FAMILY HISTORY:  Remarkable for father dying at age 98 with cancer.  No  premature coronary artery disease.   His only medication include TriCor 145 a day, aspirin a day, and  multivitamins.   He has no known allergies.   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  He sees [place] 3 plants.  He likes to piddle in his shop  in the backyard.  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  He tries to  exercise on a regular basis.  He quit smoking in October 2007, and does  not drink.   PHYSICAL EXAMINATION:  GENERAL:  Remarkable for a pleasant middle age  male in no distress.  VITAL SIGNS:  His blood pressure is 150/90.  He says he normally takes  his blood pressure at home and it rarely is over 130 systolic and  occasionally over 90 diastolic, pulse is 75 and regular, respiratory  rate 14, and afebrile.  HEENT:  Unremarkable.  NECK:  Carotids are normal without bruit.  No lymphadenopathy,  thyromegaly, or JVP elevation.  LUNGS:  Clear.  Good diaphragmatic motion.  No wheezing.  CARDIAC:  S1 and S2.  Normal heart sounds.  PMI normal.  ABDOMEN:  Benign.  Bowel sounds positive.  No AAA.  No tenderness.  No  bruit.  No hepatosplenomegaly or hepatojugular reflux.  No tenderness.  EXTREMITIES:  Distal pulses are intact.  No edema.  NEURO:  Nonfocal.  SKIN:  Warm and dry.  MUSCULOSKELETAL:  No muscular weakness.   EKG shows sinus rhythm.  He has some nonspecific ST-T wave changes in  the inferior leads and the Q-wave in lead III.   IMPRESSION:  1. Atypical chest pain.  Mildly abnormal EKG.  Followup stress      Myoview.  2. Question borderline hypertension.  The patient is hesitant to take       medicine.  Continue low-salt diet.  We will see how high his blood      pressure goes with exercise.  It may be beneficial for him to be on      low dose of an angiotensin-converting enzyme inhibitor.  3. Hypercholesterolemia and hypertriglyceridemia.  Continue TriCor.  I      suspect that the patient's LDL is greater than 113 and would      benefit from a statin drug.  He did have a calcium score of 0 in      2007, so this would be a relative recommendation.  4. Palpitations, question paroxysmal supraventricular tachycardia.      The patient will get an event monitor for 4 weeks and I will see      him back to assess whether all of this is being triggered by rapid      paroxysmal supraventricular tachycardia, leading to anxiety and      chest pain.   Further recommendations will be based on the results of his test.     Theron Arista C. Eden Emms, MD, [facility]  Electronically Signed    PCN/MedQ  DD: 07/13/2008  DT: 07/13/2008  Job #: [id number]

## 2011-03-13 NOTE — Assessment & Plan Note (Signed)
[facility]                            CARDIOLOGY OFFICE NOTE   NAME:Jeff Prince, Jeff Prince                       MRN:          [id number]  DATE:08/12/2008                            DOB:          November 13, 1960    A 50 year old patient initially referred by Dr. Regino Schultze and Dr. Sherwood Gambler  for palpitations and atypical chest pain.   I reviewed the patient's stress Myoview study, it was normal.  His EF  was normal.  I reviewed the patient's ACT Life Star report monitor.   He had no significant arrhythmias.  Even during symptoms, he had sinus  rhythm.   The patient feels that his symptoms may be due to anxiety.  He is under  a lot of pressure at work in [employer].  He has had 2 episodes of left  shoulder pain since I last saw him, both were relieved with Xanax.   I had a long discussion with [name] and I do feel that he has significant  anxiety.  I do not think any further cardiac workup is needed.   His review of systems is otherwise negative.   His meds include TriCor 145 a day, an aspirin a day, and Wellbutrin   Exam is remarkable for a somewhat tense white male in no distress.  Blood pressure is 120/70, pulse 70 and regular, respiratory rate 14,  afebrile, weight is 175.  HEENT is unremarkable.  Carotids are normal  without bruit.  No lymphadenopathy, thyromegaly, or JVP elevation.  Lungs are clear, good diaphragmatic motion.  No wheezing.  S1 and S2,  normal heart sounds, PMI normal.  Abdomen is benign.  Bowel sounds  positive.  No AAA, no tenderness, no bruit, no hepatosplenomegaly, no  hepatojugular reflux.  No tenderness.  Distal pulses are intact.  No  edema.  Neuro nonfocal.  Skin warm and dry.  No muscular weakness.   EKG at baseline is normal.   IMPRESSION:  1. Chest pain atypical, likely related to anxiety.  No need for      further workup.  Normal stress Myoview.  2. Palpitations, benign.  Again related to anxiety, ACT II monitor      benign.  3.  Anxiety/depression on Wellbutrin.  Consider changing to different      antidepressant.  Follow up with Dr. Regino Schultze.  4. Hypertriglyceridemia.  Continue low-fat diet and TriCor 145 a day.   The patient does not need to be seen in Cardiology Clinic.  He will be  seen on as-needed basis.     Noralyn Pick. Eden Emms, MD, [facility]  Electronically Signed    PCN/MedQ  DD: 08/12/2008  DT: 08/12/2008  Job #: [id number]

## 2011-03-16 NOTE — Assessment & Plan Note (Signed)
[facility]                         [facility] OFFICE NOTE   NAME:Jeff Prince, Jeff Prince                       MRN:          [id number]  DATE:09/16/2006                            DOB:          03-12-1961    Davanta returns today for followup.  I saw him in the emergency room at [facility] a  couple of weeks ago for chest pain.  He had a cardiac CT with a calcium  score of zero and no significant coronary artery disease.  He has had one  episode of pain while running since I last saw him.  Again, it sounds more  musculoskeletal in nature.   I continue to advise him to take Advil on a p.r.n. basis.   The patient's pulmonary vascular and aorta looked fine on the CT scan as  well.   He has had a cholecystectomy and has hypertriglyceridemia, on Tricor.  He is  continuing to work on his diet.  He would appear to have borderline  hypertension.   He will get some blood pressure readings for me when he sees me back in 3  months.  I told him I would prefer not to start blood pressure medicine at  this time.  He did not have significant LVH on his CT scan.   On exam, blood pressure is 130/88, pulse is 64 and regular.  HEENT is  normal.  There is no carotid bruits.  There is no lymphadenopathy.  There is  no thyromegaly.  Lungs are clear.  He has S1, S2, with normal heart sounds.  Abdomen is benign.  Lower extremities:  Intact pulses, no edema.  Neuro is  nonfocal.  Skin is warm and dry.   IMPRESSION:  1. Borderline hypertension, to follow blood pressure readings at home.      Follow up in 3 months.  2. Hypertriglyceridemia.  Continue Tricor.  3. Atypical chest pain with cardiac CT showing no coronary disease and the      calcium score is zero.   I will see him back in 3 months.     Noralyn Pick. Eden Emms, MD, [facility]  Electronically Signed    PCN/MedQ  DD: 09/16/2006  DT: 09/16/2006  Job #: [id number]

## 2011-03-16 NOTE — Consult Note (Signed)
NAME:  Jeff Prince, Jeff Prince                ACCOUNT NO.:  [id number]   MEDICAL RECORD NO.:  [id number]          PATIENT TYPE:  EMS   LOCATION:  [facility]                         FACILITY:  [facility]   PHYSICIAN:  Peter C. Eden Emms, MD, FACCDATE OF BIRTH:  [birth year]   DATE OF CONSULTATION:  DATE OF DISCHARGE:                                   CONSULTATION   DATE OF BIRTH:  12-Aug-1961   PRIMARY CARDIOLOGIST:  The patient has been seen before by Dr. Valera Castle  many years ago in [place].   PRIMARY CARE PHYSICIAN:  Dr. Karleen Hampshire   PATIENT PROFILE:  A 50 year old married white male with no prior history of  CAD who does smoke a pack a day, who presented to the [facility] ED today  with left-sided chest pain.   PROBLEM LIST:  1. Chest pain.  2. Hypertriglyceridemia.  3. Status post laparoscopic cholecystectomy in 1997.   HISTORY OF PRESENT ILLNESS:  A 50 year old married white male with no prior  history of CAD.  The risks factors include a 22 pack year history of ongoing  tobacco abuse, currently 1 pack per day.  He is otherwise healthy and active  at home without limitations.  He raked his yard on September 07, 2006, and on  September 08, 2006, he awoke with left chest/anterior axillary 3-4/10 pain  without associated symptoms that was worse with deep breathing or movement  of the left arm.  Symptoms were constant throughout the day and again worse  with deep breathing or movement.  He notes that he never really could get  comfortable yesterday.  He slept well last night and this morning, while at  work, developed returned 5-6/10 left anterior axillary pain that was worse  with deep breathing, and this time associated with nausea and mild shortness  of breath and dizziness.  He stood up and walked around outside and felt  better within about 10 minutes.  He thought maybe his blood sugar was low  and, therefore, drank some orange juice and had some crackers and shortly  after finishing  his snack he had recurrent left anterior axillary chest  pain.  He decided at that point to get in his car and drive home and says  that he drove maybe about 200 yards when he decided to return back to work  and see the nurse on the job site.  There, he was placed on oxygen with some  relief in his discomfort and then EMS was activated.  EMS performed an ECG  and brought him into the ED.  His pain relieved completely on the way in to  the ED.  He is currently pain free without complaints.   ALLERGIES:  NO KNOWN DRUG ALLERGIES.   HOME MEDICATIONS:  1. TriCor 145 mg daily.  2. Aspirin 325 mg daily.  3. Multivitamin 1 daily.  4. Vitamin E daily.  5. Vitamin C 500 mg daily.  6. Fish oil 1000 mg 2 tabs daily.   FAMILY HISTORY:  Mother is age 45 with diabetes.  Father died at age 85  with  a history of lung cancer and elevated triglycerides.  He has 1 sister and 2  brothers, all have elevated triglycerides.   SOCIAL HISTORY:  Lives in [place] with his wife.  He has 1 son who is in  college.  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  He currently smokes 1 pack a day and has done so for  the past 22 years.  He has 3-4 beers a day.  He denies any drug use.  He  runs on a treadmill 3 miles a day, Monday through Friday and also lifts  weights for 15-20 minutes every day.   REVIEW OF SYSTEMS:  Positive for diaphoresis, chest pain, shortness of  breath, and presyncope as outlined in the HPI.  Otherwise, all systems  reviewed and negative.   PHYSICAL EXAMINATION:  VITAL SIGNS:  Temperature 99.4, heart rate 69,  respirations 20, blood pressure 151/95.  Pulse oximetry 99% on 2 liters.  GENERAL:  Pleasant white male in no acute distress.  Awake, alert, and  oriented x3.  HEENT:  Atraumatic, normocephalic.  NEUROLOGICAL:  Grossly intact, nonfocal.  SKIN:  Warm and dry without lesions or masses.  NECK:  No bruits, JVD.  LUNGS:  Respirations regular,  unlabored.  Clear to auscultation.  CARDIAC:  Regular, S1 and S2.  No S3, S4, or murmurs.  ABDOMEN:  Round, soft, nontender, nondistended.  Bowel sounds present x4.  EXTREMITIES:  Warm, dry, pink.  No clubbing, cyanosis, or edema.  Dorsalis  pedis, posterior tibial pulses 2+ and equal bilaterally.   Chest x-ray shows no acute findings.  EKG shows sinus rhythm at a rate of  60.  He has T wave inversion in lead 3.   LAB WORK:  Hemoglobin 15.1, hematocrit 43.3, WBC 5.4, platelets 233.  CK-MB  2.1.  Troponin I less than 0.05.  Sodium 142, potassium 4.2, chloride 102,  CO2 28, BUN 13, creatinine 1.1, glucose 91, total protein 6.9, calcium 9.6,  albumin 3.9.  AST 27, ALT 16, alkaline phosphatase 35, total bilirubin 0.6.   ASSESSMENT AND PLAN:  1. Chest pain, fairly atypical with pleuritic and musculoskeletal      components.  His first set of cardiac markers are negative, despite      pain that persisted throughout the day yesterday and 2 recurrent      episodes this morning.  Will continue to cycle cardiac markers.  Will      plan to place an 18-20 gauge needle in the left antecubital space and      will obtain a cardiac CT today to be performed by Dr. Eden Emms.  Probably      home from ED, if cardiac CT is negative.  Continue aspirin and fibrate.  2. Hypertriglyceridemia.  Continue fibrate.  3. Tobacco abuse.  Cessation advised.  Will provide with a Chantix      prescription.  4. Hypertension.  His blood pressure is currently elevated in the ER,      although he denies any hypertension when checked at home or in his      primary care Samyah Bilbo's office.  For the time being, provided he does      not come in-house, would recommend outpatient followup with his primary      care Breken Nazari.      Nicolasa Ducking, ANP      Noralyn Pick. Eden Emms, MD, [facility]  Electronically Signed    CB/MEDQ  D:  09/09/2006  T:  09/10/2006  Job:  [id number]   cc:   Kirk Ruths, M.D.

## 2011-05-11 IMAGING — CR DG CHEST 2V
2 series · 2 of 2 positions shown · non-contrast
Comparison: Chest x-ray of 02/19/2008

CLINICAL DATA: Shortness of breath, chest pain on exertion, former
smoker

CHEST - 2 VIEW

[view not recorded (1 of 2)]
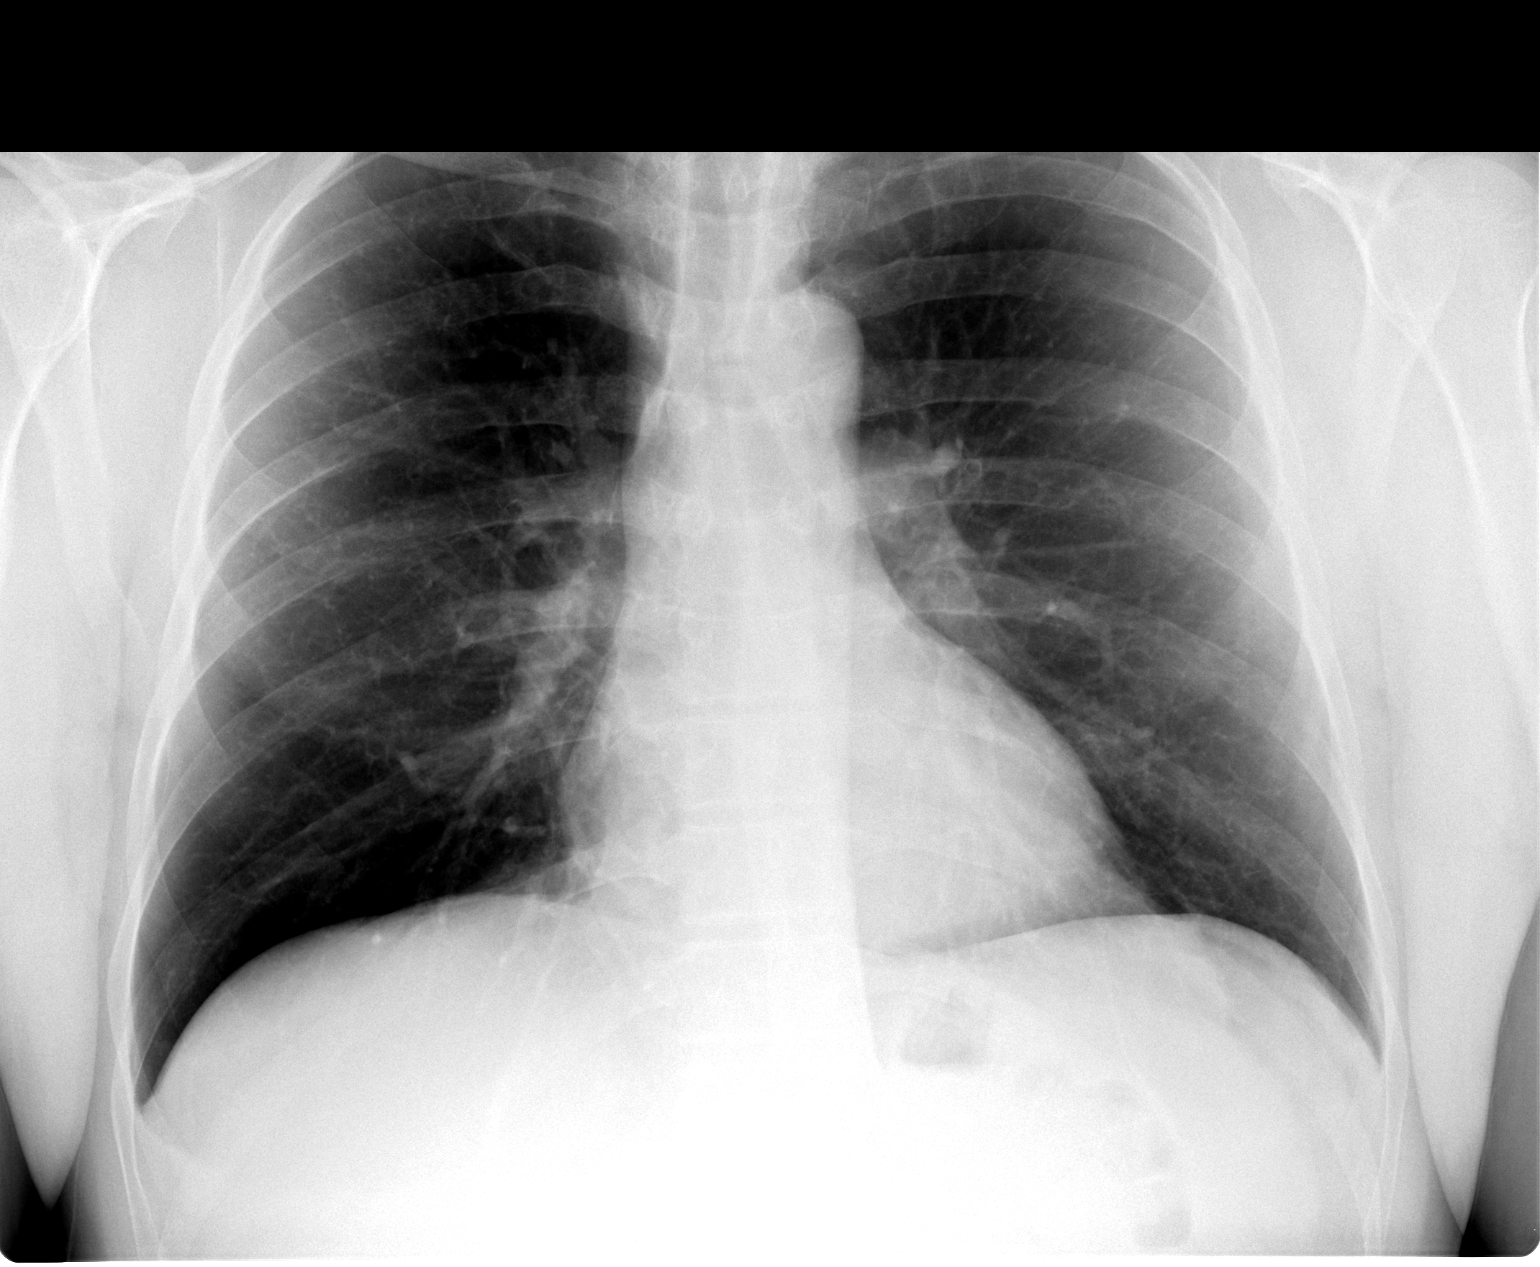

[view not recorded (2 of 2)]
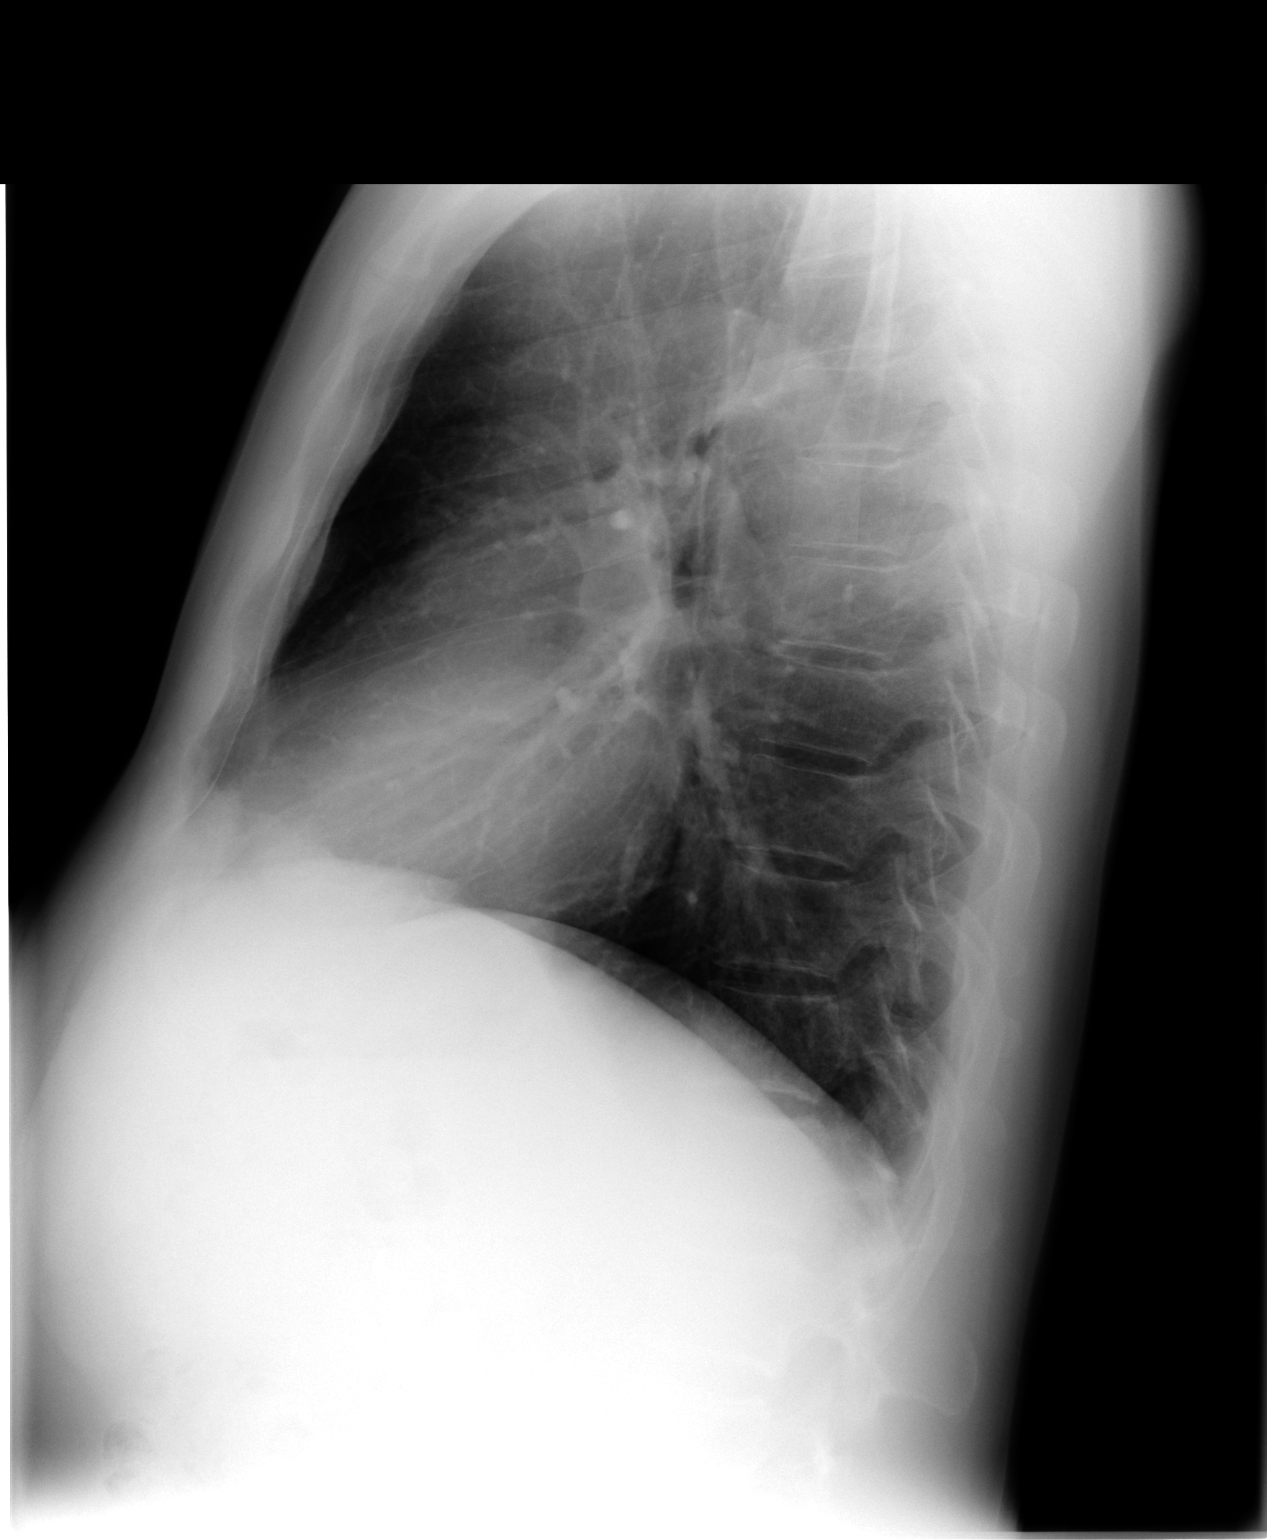

[2 of 2 positions shown; findings below may reference images not displayed]

FINDINGS: The lungs are clear.  Mediastinal contours appear stable.
The heart is within upper limits of normal.  No acute bony
abnormality is seen.
IMPRESSION: Stable chest x-ray.  No active lung disease.

## 2011-05-12 IMAGING — CR DG CHEST 2V
2 series · 2 of 2 positions shown · non-contrast
Comparison: 02/08/2010

CLINICAL DATA: Chest pain.

CHEST - 2 VIEW

[view not recorded (1 of 2)]
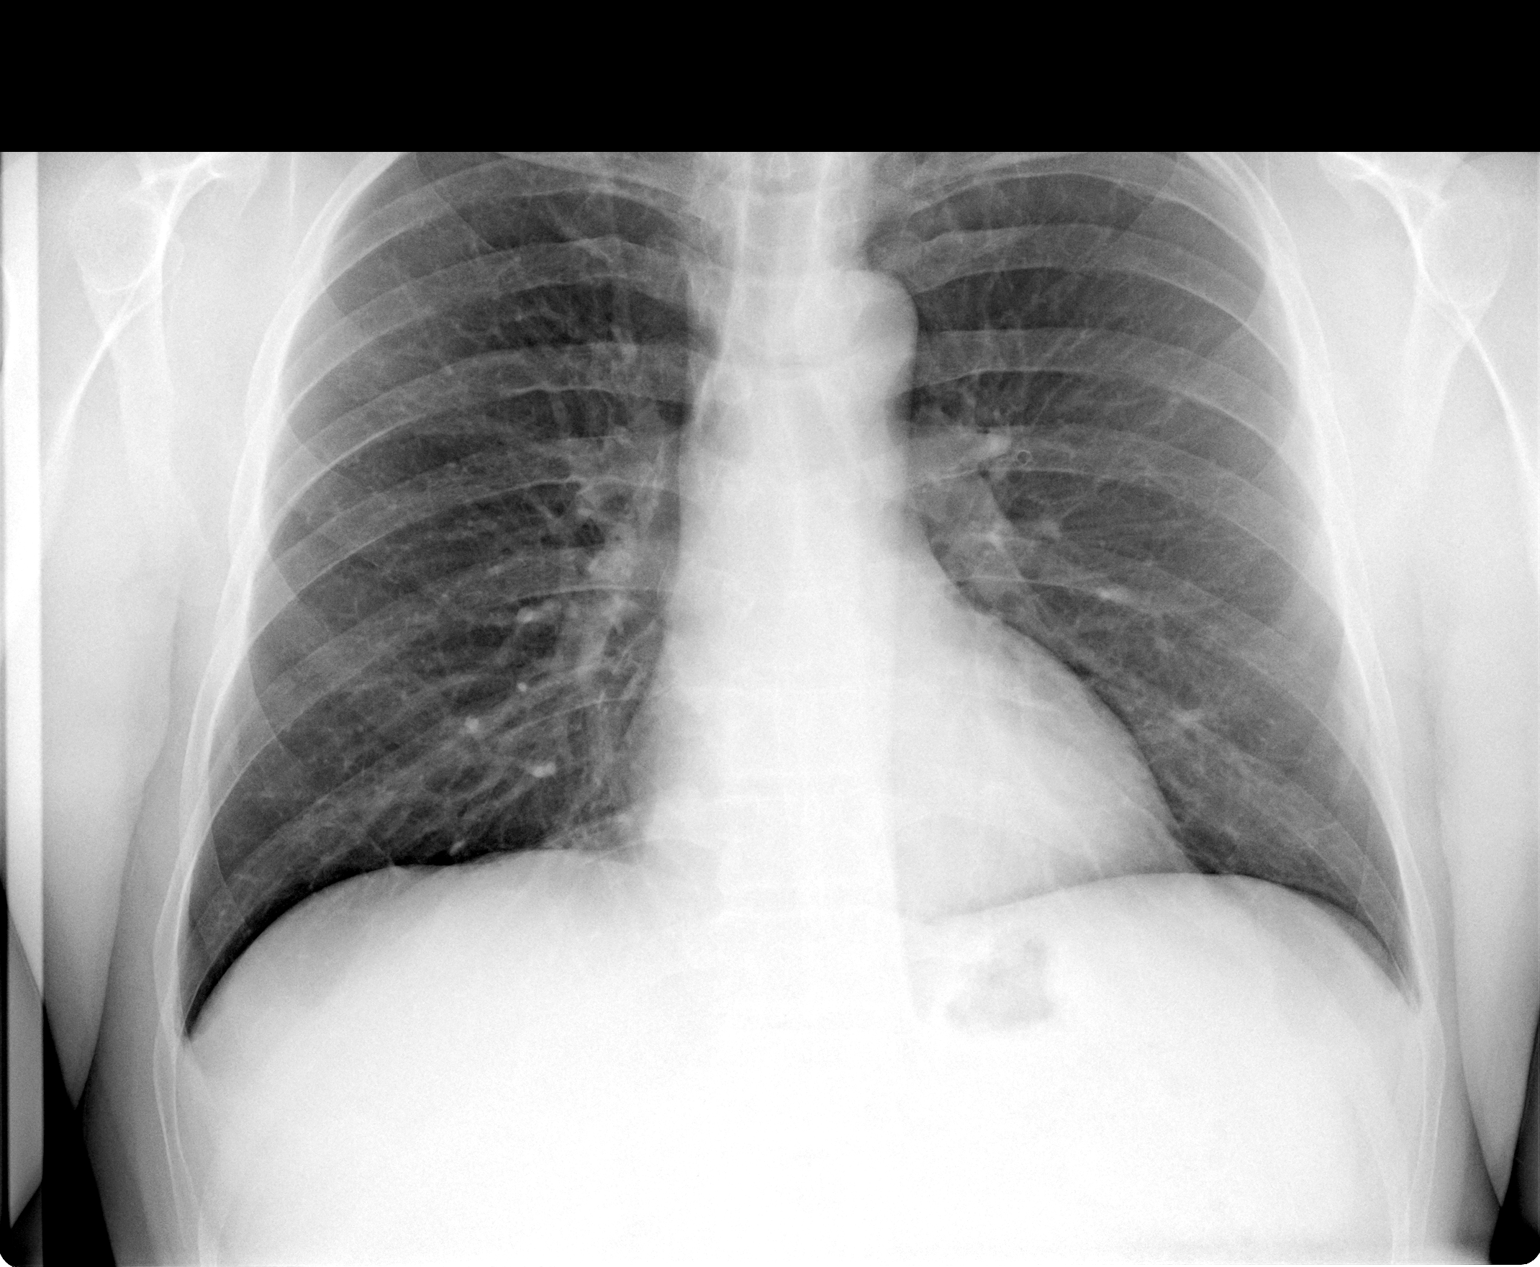

[view not recorded (2 of 2)]
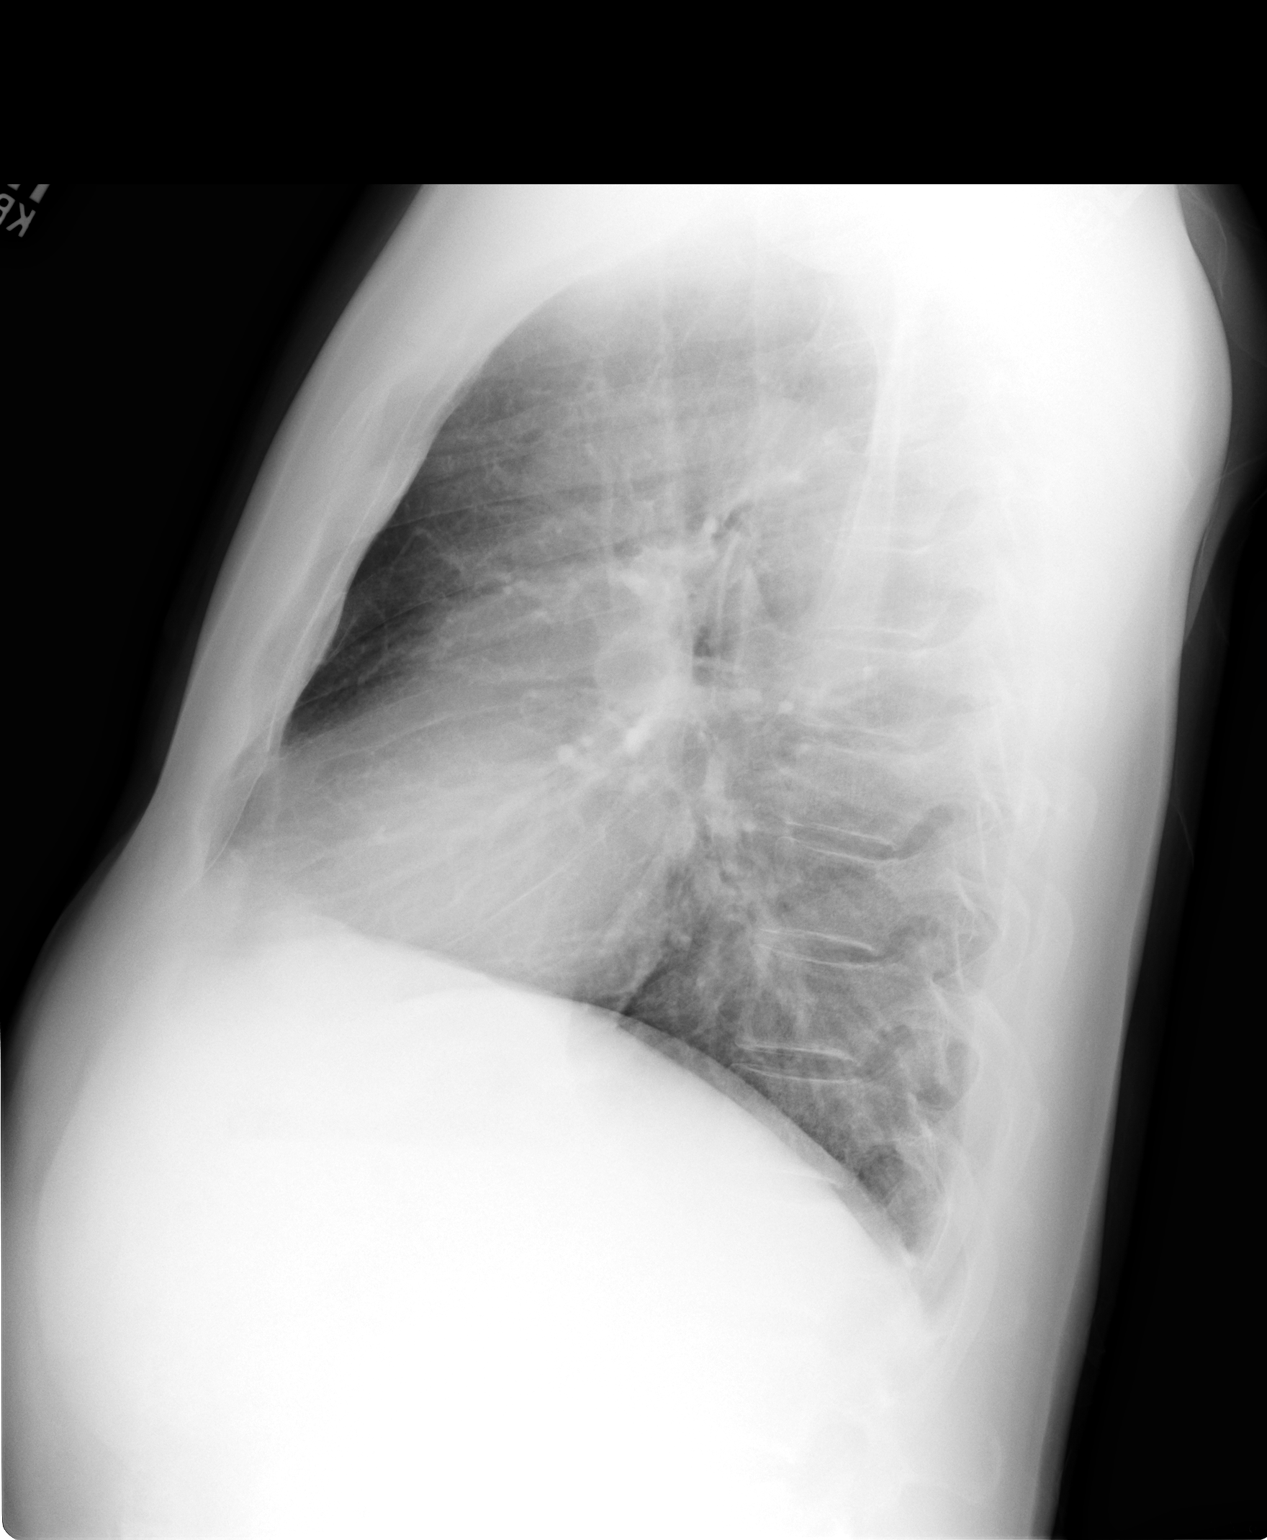

[2 of 2 positions shown; findings below may reference images not displayed]

FINDINGS: Trachea is midline.  Heart size normal.  Lungs are clear.
No pleural fluid.
IMPRESSION: No acute findings.

## 2011-06-07 ENCOUNTER — Other Ambulatory Visit: Payer: Self-pay | Admitting: *Deleted

## 2011-06-07 MED ORDER — PRASUGREL HCL 10 MG PO TABS: 10.0000 mg | ORAL_TABLET | Freq: Every day | ORAL | Status: DC

## 2011-07-03 ENCOUNTER — Other Ambulatory Visit: Payer: Self-pay | Admitting: Cardiovascular Disease

## 2011-07-03 NOTE — Telephone Encounter (Signed)
Pt needs refill of effient 10 mg, 90-day supply.

## 2011-07-06 MED ORDER — PRASUGREL HCL 10 MG PO TABS: 10.0000 mg | ORAL_TABLET | Freq: Every day | ORAL | Status: DC

## 2011-07-10 ENCOUNTER — Ambulatory Visit (INDEPENDENT_AMBULATORY_CARE_PROVIDER_SITE_OTHER): Payer: BC Managed Care – PPO | Admitting: Cardiovascular Disease

## 2011-07-10 ENCOUNTER — Encounter: Payer: Self-pay | Admitting: Cardiovascular Disease

## 2011-07-10 DIAGNOSIS — R079 Chest pain, unspecified: Secondary | ICD-10-CM

## 2011-07-10 DIAGNOSIS — R0602 Shortness of breath: Secondary | ICD-10-CM

## 2011-07-10 DIAGNOSIS — I10 Essential (primary) hypertension: Secondary | ICD-10-CM

## 2011-07-10 DIAGNOSIS — E785 Hyperlipidemia, unspecified: Secondary | ICD-10-CM

## 2011-07-10 MED ORDER — FENOFIBRATE 160 MG PO TABS: 160.0000 mg | ORAL_TABLET | Freq: Every day | ORAL | Status: DC

## 2011-07-10 MED ORDER — LOSARTAN POTASSIUM 50 MG PO TABS: 50.0000 mg | ORAL_TABLET | Freq: Every day | ORAL | Status: DC

## 2011-07-10 MED ORDER — SIMVASTATIN 40 MG PO TABS: 40.0000 mg | ORAL_TABLET | Freq: Every day | ORAL | Status: DC

## 2011-07-10 MED ORDER — PRASUGREL HCL 10 MG PO TABS: 10.0000 mg | ORAL_TABLET | Freq: Every day | ORAL | Status: DC

## 2011-07-10 NOTE — Progress Notes (Signed)
Jeff Prince is seen post hosp D/C He had a DES to the prox LAD about 10/11. He was readmitted with SSCP about a week after implantation and F/U cath showed a patent stent. He has a significatn panic/anxiety disorder that needs further Rx. I encouraged him to discuss referall by Dr Regino Schultze. He denies SSCP since 2nd D/C. He has been compliant with his meds and needs his Tricor refilled through [facility]. He is on Effient and not having any bleeding problems. All of his caths were done from the right radial with no complicaitons. BP has been running a little high I and last visit he was put on Cozaar  Reviewed BP checks from nurse at [facility] and they are in a good range  Gets occasonal rapid palpitations. Has not been on BB due to fatigue. Apparantly TC at work when checked was 199. History of generalized myalgias and focal left arm soreness. Tolerating statin  Discussed stopping Effient since he is a year and a half out from his stent.  He prefers to stay on it for 6 more months.    ROS: Denies fever, malais, weight loss, blurry vision, decreased visual acuity, cough, sputum, SOB, hemoptysis, pleuritic pain, palpitaitons, heartburn, abdominal pain, melena, lower extremity edema, claudication, or rash.  All other systems reviewed and negative  General: Affect appropriate Healthy:  appears stated age HEENT: normal Neck supple with no adenopathy JVP normal no bruits no thyromegaly Lungs clear with no wheezing and good diaphragmatic motion Heart:  S1/S2 no murmur,rub, gallop or click PMI normal Abdomen: benighn, BS positve, no tenderness, no AAA no bruit.  No HSM or HJR Distal pulses intact with no bruits No edema Neuro non-focal Skin warm and dry No muscular weakness   Current Outpatient Prescriptions  Medication Sig Dispense Refill  . ALPRAZolam (XANAX) 0.5 MG tablet as needed.      . Ascorbic Acid (VITAMIN C) 500 MG tablet Take 500 mg by mouth daily.        Marland Kitchen aspirin 81 MG tablet Take 81 mg by mouth  daily.        . Cholecalciferol (VITAMIN D) 2000 UNITS CAPS Take 1 capsule by mouth daily.        . fenofibrate 160 MG tablet Take 160 mg by mouth daily.        . fish oil-omega-3 fatty acids 1000 MG capsule Take 3 g by mouth daily.        Marland Kitchen losartan (COZAAR) 50 MG tablet Take 50 mg by mouth daily.        . Multiple Vitamin (MULTIVITAMIN) tablet Take 1 tablet by mouth daily.        . prasugrel (EFFIENT) 10 MG TABS Take 1 tablet (10 mg total) by mouth daily.  90 tablet  3  . simvastatin (ZOCOR) 40 MG tablet Take 40 mg by mouth at bedtime.        . vitamin E 100 UNIT capsule Take 200 Units by mouth daily.         Allergies  Review of patient's allergies indicates no known allergies.  Electrocardiogram:  Assessment and Plan

## 2011-07-10 NOTE — Assessment & Plan Note (Signed)
Mild white coat but home/work readings improved Continue Cozaar

## 2011-07-10 NOTE — Assessment & Plan Note (Signed)
Resolved related to anxiety.  Normal EF and normal cardiopulm exam

## 2011-07-10 NOTE — Patient Instructions (Signed)
Your physician wants you to follow-up in: 6 MONTHS You will receive a reminder letter in the mail two months in advance. If you don't receive a letter, please call our office to schedule the follow-up appointment. 

## 2011-07-10 NOTE — Assessment & Plan Note (Signed)
Resolved.  Stent LAD 10/11  Consider stopping Effient when patient wishes

## 2011-07-10 NOTE — Assessment & Plan Note (Signed)
LDL under 130 per patient report Needs better low fat diet.  Continue statin and fenofibrate

## 2011-10-04 ENCOUNTER — Telehealth: Payer: Self-pay

## 2011-10-04 NOTE — Telephone Encounter (Signed)
LMOM for pt to call. 

## 2011-10-08 NOTE — Telephone Encounter (Signed)
Pt returned call. He does have hemorrhoids and some rectal bleed. OV appt on 10/09/2011 with AS at 8:00 AM.

## 2011-10-09 ENCOUNTER — Encounter: Payer: Self-pay | Admitting: Gastroenterology

## 2011-10-09 ENCOUNTER — Ambulatory Visit (INDEPENDENT_AMBULATORY_CARE_PROVIDER_SITE_OTHER): Payer: BC Managed Care – PPO | Admitting: Gastroenterology

## 2011-10-09 VITALS — BP 136/93 | HR 65 | Temp 97.5°F | Ht 72.0 in | Wt 175.2 lb

## 2011-10-09 DIAGNOSIS — K921 Melena: Secondary | ICD-10-CM

## 2011-10-09 NOTE — Assessment & Plan Note (Signed)
50 year old male with intermittent paper hematochezia, hx of flex sig at age 75, report not available at this time. No other symptoms such as change in bowel habits, wt loss, lack of appetite. He does not vague RLQ "knotting up" when sitting occasionally X 2 years. Not associated with anything. Needs full lower GI evaluation. Likely hematochezia benign anorectal source. Will proceed with Propofol in the OR secondary to daily ETOH use X 10 years.   Proceed with TCS in the OR with Dr. Jena Gauss in near future: the risks, benefits, and alternatives have been discussed with the patient in detail. The patient states understanding and desires to proceed.

## 2011-10-09 NOTE — Progress Notes (Signed)
Cc to PCP 

## 2011-10-09 NOTE — Patient Instructions (Signed)
We have set you up for a colonoscopy with Dr. Jena Gauss in the near future.  Further recommendations to follow once this is completed.  Have a Jeff Prince Christmas!

## 2011-10-09 NOTE — Progress Notes (Signed)
Referring Provider: Kirk Ruths, MD Primary Care Physician:  Jeff Ruths, MD Primary Gastroenterologist:  Dr. Jena Gauss   Chief Complaint  Patient presents with  . Colonoscopy  . Hemorrhoids    HPI:   Jeff Prince is a pleasant 50 year old male who presents today at the request of Dr. Regino Schultze for a screening colonoscopy. He had a flex sig around 8 years ago secondary to hematochezia. We do not have these reports at the time of the visit. He reports a hx of hemorrhoids, noting intermittent paper hematochezia. Denies any current issues such as pruritis, pain, discomfort. Denies N/V, wt loss, lack of appetite. No change in bowel habits. He does report vague RLQ discomfort, like a "knotting" up that is intermittent X 2 years. States it feels as if you could turn a certain way, it would be relieved.   As of note, he does report a biliary leak s/p chole in the late 1990s; he was managed by Dr. Jena Gauss at that time.  Past Medical History  Diagnosis Date  . CAD (coronary artery disease)   . Anxiety   . HLD (hyperlipidemia)   . HTN (hypertension)     Past Surgical History  Procedure Date  . Cholecystectomy 1997    with stent, RMR  . Coronary stent placement   . Flexible sigmoidoscopy     at age of 33 in [place]  . Bile duct stent placement     Current Outpatient Prescriptions  Medication Sig Dispense Refill  . ALPRAZolam (XANAX) 0.5 MG tablet Take 0.5 mg by mouth at bedtime as needed.       . Ascorbic Acid (VITAMIN C) 500 MG tablet Take 500 mg by mouth daily.        Marland Kitchen aspirin 81 MG tablet Take 81 mg by mouth daily.        . Cholecalciferol (VITAMIN D) 2000 UNITS CAPS Take 1 capsule by mouth daily.        . Coenzyme Q10 (CO Q 10) 100 MG CAPS Take 100 mg by mouth daily.        . fenofibrate 160 MG tablet Take 1 tablet (160 mg total) by mouth daily.  90 tablet  4  . fish oil-omega-3 fatty acids 1000 MG capsule Take 3 g by mouth daily.        Marland Kitchen losartan (COZAAR) 50 MG tablet  Take 1 tablet (50 mg total) by mouth daily.  90 tablet  4  . Multiple Vitamin (MULTIVITAMIN) tablet Take 1 tablet by mouth daily.        . simvastatin (ZOCOR) 40 MG tablet Take 1 tablet (40 mg total) by mouth at bedtime.  90 tablet  4  . vitamin E 100 UNIT capsule Take 200 Units by mouth daily.       Lilian Kapur 625 MG tablet Take 1,250 mg by mouth 2 (two) times daily with a meal.       . prasugrel (EFFIENT) 10 MG TABS Take 1 tablet (10 mg total) by mouth daily.  90 tablet  4    Allergies as of 10/09/2011  . (No Known Allergies)    Family History  Problem Relation Age of Onset  . Cancer Father     lung  . Diabetes Mother   . Hyperlipidemia Sister   . Hyperlipidemia Brother   . Hyperlipidemia Brother   . Colon cancer Neg Hx     History   Social History  . Marital Status: Married    Spouse Name: N/A  Number of Children: N/A  . Years of Education: N/A   Occupational History  . textiles    Social History Main Topics  . Smoking status: Never Smoker   . Smokeless tobacco: Not on file  . Alcohol Use: Yes     2-3 beers per day last 10 years  . Drug Use: No  . Sexually Active: Not on file   Other Topics Concern  . Not on file   Social History Narrative  . No narrative on file    Review of Systems: Gen: Denies any fever, chills, loss of appetite, fatigue, weight loss. CV: Denies chest pain, heart palpitations, syncope, peripheral edema. Resp: Denies shortness of breath with rest, cough, wheezing GI: Denies dysphagia or odynophagia. Denies hematemesis, fecal incontinence, or jaundice.  GU : Denies urinary burning, urinary frequency, urinary incontinence.  MS: Denies joint pain, muscle weakness, cramps, limited movement Derm: Denies rash, itching, dry skin Psych: Denies depression, anxiety, confusion or memory loss  Heme: Denies bruising, bleeding, and enlarged lymph nodes.  Physical Exam: BP 136/93  Pulse 65  Temp(Src) 97.5 F (36.4 C) (Temporal)  Ht 6' (1.829 m)   Wt 175 lb 3.2 oz (79.47 kg)  BMI 23.76 kg/m2 General:   Alert and oriented. Well-developed, well-nourished, pleasant and cooperative. Head:  Normocephalic and atraumatic. Eyes:  Conjunctiva pink, sclera clear, no icterus.    Ears:  Normal auditory acuity. Nose:  No deformity, discharge,  or lesions. Mouth:  No deformity or lesions, mucosa pink and moist.  Neck:  Supple, without mass or thyromegaly. Lungs:  Clear to auscultation bilaterally, without wheezing, rales, or rhonchi.  Heart:  S1, S2 present without murmurs noted.  Abdomen:  +BS, soft, non-tender and non-distended. Without mass or HSM. No rebound or guarding. No hernias noted. Rectal:  Deferred  Msk:  Symmetrical without gross deformities. Normal posture. Extremities:  Without clubbing or edema. Neurologic:  Alert and  oriented x4;  grossly normal neurologically. Skin:  Intact, warm and dry without significant lesions or rashes Cervical Nodes:  No significant cervical adenopathy. Psych:  Alert and cooperative. Normal mood and affect.

## 2011-10-25 ENCOUNTER — Encounter (HOSPITAL_COMMUNITY): Payer: Self-pay | Admitting: Pharmacy Technician

## 2011-11-01 NOTE — Patient Instructions (Signed)
Jeff Prince  11/01/2011   Your procedure is scheduled on:  11/08/11  Report to [facility] at 08:15 AM.  Call this number if you have problems the morning of surgery: [phone]   Remember:   Do not eat food:After Midnight.  May have clear liquids:until Midnight .  Clear liquids include soda, tea, black coffee, apple or grape juice, broth.  Take these medicines the morning of surgery with A SIP OF WATER: Losartan. Take your Alprazolam only if needed.   Do not wear jewelry, make-up or nail polish.  Do not wear lotions, powders, or perfumes. You may wear deodorant.  Do not shave 48 hours prior to surgery.  Do not bring valuables to the hospital.  Contacts, dentures or bridgework may not be worn into surgery.  Leave suitcase in the car. After surgery it may be brought to your room.  For patients admitted to the hospital, checkout time is 11:00 AM the day of discharge.   Patients discharged the day of surgery will not be allowed to drive home.  Name and phone number of your driver:   Special Instructions: N/A   Please read over the following fact sheets that you were given: Anesthesia Post-op Instructions    Colonoscopy A colonoscopy is an exam to evaluate your entire colon. In this exam, your colon is cleansed. A long fiberoptic tube is inserted through your rectum and into your colon. The fiberoptic scope (endoscope) is a long bundle of enclosed and very flexible fibers. These fibers transmit light to the area examined and send images from that area to your caregiver. Discomfort is usually minimal. You may be given a drug to help you sleep (sedative) during or prior to the procedure. This exam helps to detect lumps (tumors), polyps, inflammation, and areas of bleeding. Your caregiver may also take a small piece of tissue (biopsy) that will be examined under a microscope. LET YOUR CAREGIVER KNOW ABOUT:   Allergies to food or medicine.   Medicines taken, including vitamins, herbs,  eyedrops, over-the-counter medicines, and creams.   Use of steroids (by mouth or creams).   Previous problems with anesthetics or numbing medicines.   History of bleeding problems or blood clots.   Previous surgery.   Other health problems, including diabetes and kidney problems.   Possibility of pregnancy, if this applies.  BEFORE THE PROCEDURE   A clear liquid diet may be required for 2 days before the exam.   Ask your caregiver about changing or stopping your regular medications.   Liquid injections (enemas) or laxatives may be required.   A large amount of electrolyte solution may be given to you to drink over a short period of time. This solution is used to clean out your colon.   You should be present 60 minutes prior to your procedure or as directed by your caregiver.  AFTER THE PROCEDURE   If you received a sedative or pain relieving medication, you will need to arrange for someone to drive you home.   Occasionally, there is a little blood passed with the first bowel movement. Do not be concerned.  FINDING OUT THE RESULTS OF YOUR TEST Not all test results are available during your visit. If your test results are not back during the visit, make an appointment with your caregiver to find out the results. Do not assume everything is normal if you have not heard from your caregiver or the medical facility. It is important for you to follow up on  all of your test results. HOME CARE INSTRUCTIONS   It is not unusual to pass moderate amounts of gas and experience mild abdominal cramping following the procedure. This is due to air being used to inflate your colon during the exam. Walking or a warm pack on your belly (abdomen) may help.   You may resume all normal meals and activities after sedatives and medicines have worn off.   Only take over-the-counter or prescription medicines for pain, discomfort, or fever as directed by your caregiver. Do not use aspirin or blood thinners  if a biopsy was taken. Consult your caregiver for medicine usage if biopsies were taken.  SEEK IMMEDIATE MEDICAL CARE IF:   You have a fever.   You pass large blood clots or fill a toilet with blood following the procedure. This may also occur 10 to 14 days following the procedure. This is more likely if a biopsy was taken.   You develop abdominal pain that keeps getting worse and cannot be relieved with medicine.  Document Released: 10/12/2000 Document Revised: 06/27/2011 Document Reviewed: 05/27/2008 [facility] Patient Information 2012 [place], [place].   PATIENT INSTRUCTIONS POST-ANESTHESIA  IMMEDIATELY FOLLOWING SURGERY:  Do not drive or operate machinery for the first twenty four hours after surgery.  Do not make any important decisions for twenty four hours after surgery or while taking narcotic pain medications or sedatives.  If you develop intractable nausea and vomiting or a severe headache please notify your doctor immediately.  FOLLOW-UP:  Please make an appointment with your surgeon as instructed. You do not need to follow up with anesthesia unless specifically instructed to do so.  WOUND CARE INSTRUCTIONS (if applicable):  Keep a dry clean dressing on the anesthesia/puncture wound site if there is drainage.  Once the wound has quit draining you may leave it open to air.  Generally you should leave the bandage intact for twenty four hours unless there is drainage.  If the epidural site drains for more than 36-48 hours please call the anesthesia department.  QUESTIONS?:  Please feel free to call your physician or the hospital operator if you have any questions, and they will be happy to assist you.     [facility] Anesthesia Department [address] [place] [phone]

## 2011-11-02 ENCOUNTER — Encounter (HOSPITAL_COMMUNITY)
Admission: RE | Admit: 2011-11-02 | Discharge: 2011-11-02 | Payer: BC Managed Care – PPO | Source: Ambulatory Visit | Admitting: Internal Medicine

## 2011-11-05 ENCOUNTER — Encounter (HOSPITAL_COMMUNITY): Payer: Self-pay

## 2011-11-05 ENCOUNTER — Encounter (HOSPITAL_COMMUNITY)
Admission: RE | Admit: 2011-11-05 | Discharge: 2011-11-05 | Disposition: A | Discharge status: Home / Self Care | Payer: BC Managed Care – PPO | Source: Ambulatory Visit | Attending: Internal Medicine | Admitting: Internal Medicine

## 2011-11-05 LAB — HEMOGLOBIN AND HEMATOCRIT, BLOOD
HCT: 41.4 % (ref 39.0–52.0)
Hemoglobin: 13.8 g/dL (ref 13.0–17.0)

## 2011-11-05 LAB — BASIC METABOLIC PANEL
Calcium: 10.2 mg/dL (ref 8.4–10.5)
Chloride: 103 mEq/L (ref 96–112)
Creatinine, Ser: 1.15 mg/dL (ref 0.50–1.35)
GFR calc Af Amer: 84 mL/min — ABNORMAL LOW (ref >90)
Sodium: 140 mEq/L (ref 135–145)

## 2011-11-08 ENCOUNTER — Encounter (HOSPITAL_COMMUNITY)
Admission: RE | Disposition: A | Discharge status: Home / Self Care | Payer: Self-pay | Source: Ambulatory Visit | Attending: Internal Medicine

## 2011-11-08 ENCOUNTER — Encounter (HOSPITAL_COMMUNITY): Payer: Self-pay | Admitting: *Deleted

## 2011-11-08 ENCOUNTER — Encounter (HOSPITAL_COMMUNITY): Payer: Self-pay | Admitting: Anesthesiology

## 2011-11-08 ENCOUNTER — Ambulatory Visit (HOSPITAL_COMMUNITY)
Admission: RE | Admit: 2011-11-08 | Discharge: 2011-11-08 | Disposition: A | Discharge status: Home / Self Care | Payer: BC Managed Care – PPO | Source: Ambulatory Visit | Attending: Internal Medicine | Admitting: Internal Medicine

## 2011-11-08 ENCOUNTER — Ambulatory Visit (HOSPITAL_COMMUNITY): Payer: BC Managed Care – PPO | Admitting: Anesthesiology

## 2011-11-08 DIAGNOSIS — Z1211 Encounter for screening for malignant neoplasm of colon: Secondary | ICD-10-CM

## 2011-11-08 DIAGNOSIS — K5730 Diverticulosis of large intestine without perforation or abscess without bleeding: Secondary | ICD-10-CM

## 2011-11-08 DIAGNOSIS — K648 Other hemorrhoids: Secondary | ICD-10-CM

## 2011-11-08 DIAGNOSIS — Z01812 Encounter for preprocedural laboratory examination: Secondary | ICD-10-CM | POA: Insufficient documentation

## 2011-11-08 DIAGNOSIS — I10 Essential (primary) hypertension: Secondary | ICD-10-CM | POA: Insufficient documentation

## 2011-11-08 DIAGNOSIS — E785 Hyperlipidemia, unspecified: Secondary | ICD-10-CM | POA: Insufficient documentation

## 2011-11-08 DIAGNOSIS — Z79899 Other long term (current) drug therapy: Secondary | ICD-10-CM | POA: Insufficient documentation

## 2011-11-08 SURGERY — COLONOSCOPY WITH PROPOFOL
Anesthesia: Monitor Anesthesia Care | Site: Rectum

## 2011-11-08 MED ORDER — MIDAZOLAM HCL 2 MG/2ML IJ SOLN
1.0000 mg | INTRAMUSCULAR | Status: DC | PRN
  Administered 2011-11-08: 2 mg via INTRAVENOUS

## 2011-11-08 MED ORDER — PROPOFOL 10 MG/ML IV EMUL
INTRAVENOUS | Status: AC
  Filled 2011-11-08: qty 20

## 2011-11-08 MED ORDER — ONDANSETRON HCL 4 MG/2ML IJ SOLN
INTRAMUSCULAR | Status: AC
  Administered 2011-11-08: 4 mg via INTRAVENOUS
  Filled 2011-11-08: qty 2

## 2011-11-08 MED ORDER — GLYCOPYRROLATE 0.2 MG/ML IJ SOLN
0.2000 mg | Freq: Once | INTRAMUSCULAR | Status: AC
  Administered 2011-11-08: 0.2 mg via INTRAVENOUS

## 2011-11-08 MED ORDER — FENTANYL CITRATE 0.05 MG/ML IJ SOLN: 25.0000 ug | INTRAMUSCULAR | Status: DC | PRN

## 2011-11-08 MED ORDER — LACTATED RINGERS IV SOLN
INTRAVENOUS | Status: DC
  Administered 2011-11-08: 10:00:00 via INTRAVENOUS

## 2011-11-08 MED ORDER — STERILE WATER FOR IRRIGATION IR SOLN
Status: DC | PRN
  Administered 2011-11-08: 10:00:00

## 2011-11-08 MED ORDER — GLYCOPYRROLATE 0.2 MG/ML IJ SOLN
INTRAMUSCULAR | Status: AC
  Filled 2011-11-08: qty 1

## 2011-11-08 MED ORDER — FENTANYL CITRATE 0.05 MG/ML IJ SOLN
INTRAMUSCULAR | Status: DC | PRN
  Administered 2011-11-08 (×2): 25 ug via INTRAVENOUS
  Administered 2011-11-08: 50 ug via INTRAVENOUS

## 2011-11-08 MED ORDER — PROPOFOL 10 MG/ML IV EMUL
INTRAVENOUS | Status: DC | PRN
  Administered 2011-11-08: 100 ug/kg/min via INTRAVENOUS

## 2011-11-08 MED ORDER — FENTANYL CITRATE 0.05 MG/ML IJ SOLN
INTRAMUSCULAR | Status: AC
  Filled 2011-11-08: qty 2

## 2011-11-08 MED ORDER — ONDANSETRON HCL 4 MG/2ML IJ SOLN: 4.0000 mg | Freq: Once | INTRAMUSCULAR | Status: DC | PRN

## 2011-11-08 MED ORDER — LIDOCAINE HCL (CARDIAC) 10 MG/ML IV SOLN
INTRAVENOUS | Status: DC | PRN
  Administered 2011-11-08: 10 mg via INTRAVENOUS

## 2011-11-08 MED ORDER — ONDANSETRON HCL 4 MG/2ML IJ SOLN
4.0000 mg | Freq: Once | INTRAMUSCULAR | Status: AC
  Administered 2011-11-08: 4 mg via INTRAVENOUS

## 2011-11-08 MED ORDER — MIDAZOLAM HCL 2 MG/2ML IJ SOLN
INTRAMUSCULAR | Status: AC
  Administered 2011-11-08: 2 mg via INTRAVENOUS
  Filled 2011-11-08: qty 2

## 2011-11-08 SURGICAL SUPPLY — 22 items
ELECT REM PT RETURN 9FT ADLT (ELECTROSURGICAL)
ELECTRODE REM PT RTRN 9FT ADLT (ELECTROSURGICAL) IMPLANT
FCP BXJMBJMB 240X2.8X (CUTTING FORCEPS)
FLOOR PAD 36X40 (MISCELLANEOUS) ×2
FORCEPS BIOP RAD 4 LRG CAP 4 (CUTTING FORCEPS) IMPLANT
FORCEPS BIOP RJ4 240 W/NDL (CUTTING FORCEPS)
FORCEPS BXJMBJMB 240X2.8X (CUTTING FORCEPS) IMPLANT
INJECTOR/SNARE I SNARE (MISCELLANEOUS) IMPLANT
LUBRICANT JELLY 4.5OZ STERILE (MISCELLANEOUS) ×1 IMPLANT
MANIFOLD NEPTUNE II (INSTRUMENTS) ×1 IMPLANT
NDL SCLEROTHERAPY 25GX240 (NEEDLE) IMPLANT
NEEDLE SCLEROTHERAPY 25GX240 (NEEDLE) IMPLANT
PAD FLOOR 36X40 (MISCELLANEOUS) ×1 IMPLANT
PROBE APC STR FIRE (PROBE) IMPLANT
PROBE INJECTION GOLD (MISCELLANEOUS)
PROBE INJECTION GOLD 7FR (MISCELLANEOUS) IMPLANT
SNARE ROTATE MED OVAL 20MM (MISCELLANEOUS) IMPLANT
SYR 50ML LL SCALE MARK (SYRINGE) ×1 IMPLANT
TRAP SPECIMEN MUCOUS 40CC (MISCELLANEOUS) IMPLANT
TUBING ENDO SMARTCAP PENTAX (MISCELLANEOUS) IMPLANT
TUBING IRRIGATION ENDOGATOR (MISCELLANEOUS) ×2 IMPLANT
WATER STERILE IRR 1000ML POUR (IV SOLUTION) ×1 IMPLANT

## 2011-11-08 NOTE — Anesthesia Preprocedure Evaluation (Addendum)
Anesthesia Evaluation  Patient identified by MRN, date of birth, ID band Patient awake    Reviewed: Allergy & Precautions, H&P , NPO status , Patient's Chart, lab work & pertinent test results  History of Anesthesia Complications Negative for: history of anesthetic complications  Airway Mallampati: I      Dental  (+) Teeth Intact   Pulmonary neg pulmonary ROS,  clear to auscultation        Cardiovascular hypertension, Pt. on medications + CAD (no angina now) Regular Normal    Neuro/Psych Anxiety    GI/Hepatic   Endo/Other    Renal/GU      Musculoskeletal   Abdominal   Peds  Hematology   Anesthesia Other Findings   Reproductive/Obstetrics                           Anesthesia Physical Anesthesia Plan  ASA: III  Anesthesia Plan: MAC   Post-op Pain Management:    Induction: Intravenous  Airway Management Planned: Simple Face Mask  Additional Equipment:   Intra-op Plan:   Post-operative Plan:   Informed Consent: I have reviewed the patients History and Physical, chart, labs and discussed the procedure including the risks, benefits and alternatives for the proposed anesthesia with the patient or authorized representative who has indicated his/her understanding and acceptance.     Plan Discussed with:   Anesthesia Plan Comments:         Anesthesia Quick Evaluation

## 2011-11-08 NOTE — Anesthesia Postprocedure Evaluation (Signed)
Anesthesia Post Note  Patient: Jeff Prince  Procedure(s) Performed:  COLONOSCOPY WITH PROPOFOL - In cecum@ 1020   withdrawal time .  Anesthesia type: MAC  Patient location: PACU  Post pain: Pain level controlled  Post assessment: Post-op Vital signs reviewed, Patient's Cardiovascular Status Stable, Respiratory Function Stable, Patent Airway, No signs of Nausea or vomiting and Pain level controlled  Last Vitals:  Filed Vitals:   11/08/11 1036  BP: 116/85  Pulse:   Temp: 36.7 C  Resp: 19    Post vital signs: Reviewed and stable Hr 79  Level of consciousness: awake and alert   Complications: No apparent anesthesia complications

## 2011-11-08 NOTE — Transfer of Care (Signed)
Immediate Anesthesia Transfer of Care Note  Patient: Jeff Prince  Procedure(s) Performed:  COLONOSCOPY WITH PROPOFOL - In cecum@ 1020   withdrawal time .  Patient Location: PACU  Anesthesia Type: MAC  Level of Consciousness: awake  Airway & Oxygen Therapy: Patient Spontanous Breathing.  Post-op Assessment: Report given to PACU RN, Post -op Vital signs reviewed and stable and Patient moving all extremities  Post vital signs: Reviewed and stable  Complications: No apparent anesthesia complications

## 2011-11-08 NOTE — Anesthesia Procedure Notes (Signed)
Procedure Name: MAC Date/Time: 11/08/2011 9:57 AM Performed by: Minerva Areola Pre-anesthesia Checklist: Patient identified, Patient being monitored, Emergency Drugs available, Timeout performed and Suction available Patient Re-evaluated:Patient Re-evaluated prior to inductionOxygen Delivery Method: Simple face mask

## 2011-11-08 NOTE — Op Note (Signed)
[facility] [address] [place], [place]  [address]  COLONOSCOPY PROCEDURE REPORT  PATIENT:  Jeff Prince, Jeff Prince  MR#:  [id number] BIRTHDATE:  10-25-1961, 50 yrs. old  GENDER:  male ENDOSCOPIST:  R. Roetta Sessions, MD FACP [facility] REF. BY:          Dr. Yetta Numbers PROCEDURE DATE:  11/08/2011 PROCEDURE:  screening colonoscopy.  INDICATIONS:  first ever average risk screening colonoscopy. Currently no bowel  symptoms. In the past, occasional paper hematochezia.  INFORMED CONSENT:  The risks, benefits, alternatives and imponderables including but not limited to bleeding, perforation as well as the possibility of a missed lesion have been reviewed. The potential for biopsy, lesion removal, etc. have also been discussed.  Questions have been answered.  All parties agreeable. Please see the history and physical in the medical record for more information.  MEDICATIONS:  deep sedation per Dr. [facility]  DESCRIPTION OF PROCEDURE:  After a digital rectal exam was performed, the  colonoscope was advanced from the anus through the rectum and colon to the area of the cecum, ileocecal valve and appendiceal orifice.  The cecum was deeply intubated.  These structures were well-seen and photographed for the record.  From the level of the cecum and ileocecal valve, the scope was slowly and cautiously withdrawn.  The mucosal surfaces were carefully surveyed utilizing scope tip deflection to facilitate fold flattening as needed.  The scope was pulled down into the rectum where a thorough examination including retroflexion was performed. <<PROCEDUREIMAGES>>  FINDINGS:    adequate preparation. Left-sided/ transverse diverticula; otherwise, normal colon and distal 10 cm of terminal ileum. Internal             hemorrhoids; otherwise normal rectal mucosa.  THERAPEUTIC / DIAGNOSTIC MANEUVERS PERFORMED:   none  COMPLICATIONS:  none  CECAL WITHDRAWAL TIME:8 minutes  IMPRESSION:   Internal hemorrhoids otherwise normal rectum. Left-sided /transverse diverticula; remainder of colon and terminal ileum       appeared normal.  RECOMMENDATIONS:  Hemorrhoids/ diverticulosis literature provided. Consider daily fiber supplement. Repeat screening colonoscopy in 10 years.  ______________________________ R. Roetta Sessions, MD Caleen Essex  CC:  Karleen Hampshire, MD  n. eSIGNED:   R. Roetta Sessions at 11/08/2011 10:47 AM  Janalyn Rouse, [phone]

## 2011-11-08 NOTE — H&P (Signed)
  I have seen & examined the patient prior to the procedure(s) today and reviewed the history and physical/consultation.  There have been no changes.  After consideration of the risks, benefits, alternatives and imponderables, the patient has consented to the procedure(s).   

## 2012-01-07 ENCOUNTER — Encounter: Payer: Self-pay | Admitting: Cardiovascular Disease

## 2012-01-07 ENCOUNTER — Ambulatory Visit (INDEPENDENT_AMBULATORY_CARE_PROVIDER_SITE_OTHER): Payer: BC Managed Care – PPO | Admitting: Cardiovascular Disease

## 2012-01-07 DIAGNOSIS — I2510 Atherosclerotic heart disease of native coronary artery without angina pectoris: Secondary | ICD-10-CM

## 2012-01-07 DIAGNOSIS — I10 Essential (primary) hypertension: Secondary | ICD-10-CM

## 2012-01-07 DIAGNOSIS — F419 Anxiety disorder, unspecified: Secondary | ICD-10-CM | POA: Insufficient documentation

## 2012-01-07 DIAGNOSIS — F411 Generalized anxiety disorder: Secondary | ICD-10-CM

## 2012-01-07 DIAGNOSIS — E785 Hyperlipidemia, unspecified: Secondary | ICD-10-CM

## 2012-01-07 NOTE — Assessment & Plan Note (Signed)
Stable with no angina and good activity level.  Continue medical Rx  

## 2012-01-07 NOTE — Assessment & Plan Note (Signed)
Refer to Dr Dellia Cloud.  Need a better plan than PRN xanax.  Significant issue with patient especially in past when it created multiple visits for chest pain and repeart caths

## 2012-01-07 NOTE — Patient Instructions (Signed)
Your physician wants you to follow-up in: YEAR WITH DR Haywood Filler will receive a reminder letter in the mail two months in advance. If you don't receive a letter, please call our office to schedule the follow-up appointment. Your physician recommends that you continue on your current medications as directed. Please refer to the Current Medication list given to you today. You have been referred to DR [facility]  DX ANXIETY

## 2012-01-07 NOTE — Assessment & Plan Note (Signed)
Cholesterol is at goal.  Continue current dose of statin and diet Rx.  No myalgias or side effects.  F/U  LFT's in 6 months. No results found for this basename: LDLCALC  F/U labs with Dr Regino Schultze

## 2012-01-07 NOTE — Progress Notes (Signed)
Jeff Prince is seen post hosp D/C He had a DES to the prox LAD about 10/11. He was readmitted with SSCP about a week after implantation and F/U cath showed a patent stent. He has a significatn panic/anxiety disorder that needs further Rx. I encouraged him to discuss referall by Dr Regino Schultze. He denies SSCP since 2nd D/C. He has been compliant with his meds and needs his Tricor refilled through [facility]. He is on Effient and not having any bleeding problems. All of his caths were done from the right radial with no complicaitons. BP has been running a little high I and last visit he was put on Cozaar Reviewed BP checks from [facility] at [facility] and they are in a good range  Gets occasonal rapid palpitations. Has not been on BB due to fatigue. Apparantly TC at work when checked was 199. History of generalized myalgias and focal left arm soreness. Tolerating statin   Effient stopped last visit  ROS: Denies fever, malais, weight loss, blurry vision, decreased visual acuity, cough, sputum, SOB, hemoptysis, pleuritic pain, palpitaitons, heartburn, abdominal pain, melena, lower extremity edema, claudication, or rash.  All other systems reviewed and negative  General: Affect appropriate Healthy:  appears stated age HEENT: normal Neck supple with no adenopathy JVP normal no bruits no thyromegaly Lungs clear with no wheezing and good diaphragmatic motion Heart:  S1/S2 no murmur, no rub, gallop or click PMI normal Abdomen: benighn, BS positve, no tenderness, no AAA no bruit.  No HSM or HJR Distal pulses intact with no bruits No edema Neuro non-focal Skin warm and dry No muscular weakness   Current Outpatient Prescriptions  Medication Sig Dispense Refill  . ALPRAZolam (XANAX) 0.5 MG tablet Take 0.5 mg by mouth at bedtime as needed. Sleep/Anxiety      . Ascorbic Acid (VITAMIN C) 500 MG tablet Take 500 mg by mouth daily.        Marland Kitchen aspirin 81 MG tablet Take 81 mg by mouth daily.        . Cholecalciferol (VITAMIN D) 2000  UNITS CAPS Take 1 capsule by mouth daily.        . Coenzyme Q10 (CO Q 10) 100 MG CAPS Take 100 mg by mouth daily.        . fenofibrate 160 MG tablet Take 1 tablet (160 mg total) by mouth daily.  90 tablet  4  . fish oil-omega-3 fatty acids 1000 MG capsule Take 2 g by mouth 2 (two) times daily.       Marland Kitchen losartan (COZAAR) 50 MG tablet Take 1 tablet (50 mg total) by mouth daily.  90 tablet  4  . Multiple Vitamin (MULTIVITAMIN) tablet Take 1 tablet by mouth daily.        . simvastatin (ZOCOR) 40 MG tablet Take 1 tablet (40 mg total) by mouth at bedtime.  90 tablet  4  . vitamin E 100 UNIT capsule Take 200 Units by mouth daily.         Allergies  Review of patient's allergies indicates no known allergies.  Electrocardiogram:  Assessment and Plan

## 2012-01-07 NOTE — Assessment & Plan Note (Signed)
Well controlled.  Continue current medications and low sodium Dash type diet.    

## 2012-01-15 ENCOUNTER — Ambulatory Visit (INDEPENDENT_AMBULATORY_CARE_PROVIDER_SITE_OTHER): Payer: BC Managed Care – PPO | Admitting: Psychology

## 2012-01-15 DIAGNOSIS — F411 Generalized anxiety disorder: Secondary | ICD-10-CM

## 2012-08-11 ENCOUNTER — Other Ambulatory Visit: Payer: Self-pay | Admitting: Cardiovascular Disease

## 2012-08-17 ENCOUNTER — Other Ambulatory Visit: Payer: Self-pay | Admitting: Cardiovascular Disease

## 2012-11-17 ENCOUNTER — Other Ambulatory Visit: Payer: Self-pay | Admitting: Cardiovascular Disease

## 2012-11-17 MED ORDER — LOSARTAN POTASSIUM 50 MG PO TABS: 50.0000 mg | ORAL_TABLET | Freq: Every day | ORAL | Status: DC

## 2013-07-14 ENCOUNTER — Other Ambulatory Visit: Payer: Self-pay | Admitting: Cardiovascular Disease

## 2013-07-20 ENCOUNTER — Other Ambulatory Visit: Payer: Self-pay | Admitting: Cardiovascular Disease

## 2013-07-31 ENCOUNTER — Other Ambulatory Visit: Payer: Self-pay | Admitting: Cardiovascular Disease

## 2013-10-12 ENCOUNTER — Other Ambulatory Visit: Payer: Self-pay | Admitting: Cardiovascular Disease

## 2013-10-17 ENCOUNTER — Other Ambulatory Visit: Payer: Self-pay | Admitting: Cardiovascular Disease

## 2013-10-29 ENCOUNTER — Other Ambulatory Visit: Payer: Self-pay | Admitting: Cardiovascular Disease

## 2013-11-20 ENCOUNTER — Other Ambulatory Visit: Payer: Self-pay

## 2013-11-20 MED ORDER — LOSARTAN POTASSIUM 50 MG PO TABS: 50.0000 mg | ORAL_TABLET | Freq: Every day | ORAL | Status: DC

## 2014-06-08 ENCOUNTER — Other Ambulatory Visit (HOSPITAL_COMMUNITY): Payer: Self-pay | Admitting: Family Medicine

## 2014-06-08 DIAGNOSIS — R079 Chest pain, unspecified: Secondary | ICD-10-CM

## 2014-06-08 DIAGNOSIS — R109 Unspecified abdominal pain: Secondary | ICD-10-CM

## 2014-06-09 ENCOUNTER — Encounter: Payer: Self-pay | Admitting: Cardiovascular Disease

## 2014-06-14 ENCOUNTER — Ambulatory Visit (HOSPITAL_COMMUNITY)
Admission: RE | Admit: 2014-06-14 | Discharge: 2014-06-14 | Disposition: A | Discharge status: Home / Self Care | Payer: BC Managed Care – PPO | Source: Ambulatory Visit | Attending: Family Medicine | Admitting: Family Medicine

## 2014-06-14 DIAGNOSIS — R079 Chest pain, unspecified: Secondary | ICD-10-CM | POA: Diagnosis not present

## 2014-06-14 DIAGNOSIS — R109 Unspecified abdominal pain: Secondary | ICD-10-CM | POA: Diagnosis not present

## 2014-06-16 ENCOUNTER — Encounter: Payer: Self-pay | Admitting: Cardiovascular Disease

## 2014-06-16 ENCOUNTER — Ambulatory Visit (INDEPENDENT_AMBULATORY_CARE_PROVIDER_SITE_OTHER): Payer: BC Managed Care – PPO | Admitting: Cardiovascular Disease

## 2014-06-16 VITALS — BP 138/80 | HR 86 | Ht 72.0 in | Wt 181.0 lb

## 2014-06-16 DIAGNOSIS — E785 Hyperlipidemia, unspecified: Secondary | ICD-10-CM

## 2014-06-16 DIAGNOSIS — I10 Essential (primary) hypertension: Secondary | ICD-10-CM

## 2014-06-16 DIAGNOSIS — F411 Generalized anxiety disorder: Secondary | ICD-10-CM

## 2014-06-16 DIAGNOSIS — R079 Chest pain, unspecified: Secondary | ICD-10-CM

## 2014-06-16 DIAGNOSIS — F419 Anxiety disorder, unspecified: Secondary | ICD-10-CM

## 2014-06-16 NOTE — Assessment & Plan Note (Signed)
Seems better controlled f/u primary

## 2014-06-16 NOTE — Patient Instructions (Signed)
Your physician has requested that you have an exercise tolerance test. For further information please visit [email or web address]. Please also follow instruction sheet, as given.  Your physician recommends that you continue on your current medications as directed. Please refer to the Current Medication list given to you today.  Your physician wants you to follow-up in: 6 MONTHS with Dr. Johnsie Cancel. You will receive a reminder letter in the mail two months in advance. If you don't receive a letter, please call our office to schedule the follow-up appointment.

## 2014-06-16 NOTE — Progress Notes (Signed)
Patient ID: Jeff Prince, male   DOB: February 09, 1961, 53 y.o.   MRN: [id number] Jeff Prince is seen for f/u CAD  He had a DES to the prox LAD  On  10/11. He was readmitted with SSCP about a week after implantation and F/U cath showed a patent stent. He has a significatn panic/anxiety disorder that needs further Rx. I encouraged him to discuss referall by Dr Orson Ape. He denies SSCP since 2nd D/C. He has been compliant with his meds   . All of his caths were done from the right radial with no complicaitons. BP has been running a little high I and last visit he was put on Cozaar Reviewed BP checks from [facility] at [facility] and they are in a good range   Getting some chest pain last 3 weeks.  Some exertional but not all  A week ago had severe pain left arm and pectroral area Of/On all day ? Muscular  Some PT on neck helped a bit This pain was intermitant and non exertional   Son is a Software engineer in [place] and graduated from [employer] in 2013   ROS: Denies fever, malais, weight loss, blurry vision, decreased visual acuity, cough, sputum, SOB, hemoptysis, pleuritic pain, palpitaitons, heartburn, abdominal pain, melena, lower extremity edema, claudication, or rash.  All other systems reviewed and negative  General: Affect appropriate Healthy:  appears stated age 53: normal Neck supple with no adenopathy JVP normal no bruits no thyromegaly Lungs clear with no wheezing and good diaphragmatic motion Heart:  S1/S2 no murmur, no rub, gallop or click PMI normal Abdomen: benighn, BS positve, no tenderness, no AAA no bruit.  No HSM or HJR Distal pulses intact with no bruits No edema Neuro non-focal Skin warm and dry No muscular weakness   Current Outpatient Prescriptions  Medication Sig Dispense Refill  . ALPRAZolam (XANAX) 0.5 MG tablet Take 0.5 mg by mouth at bedtime as needed. Sleep/Anxiety      . Ascorbic Acid (VITAMIN C) 500 MG tablet Take 500 mg by mouth daily.        Marland Kitchen aspirin 81 MG tablet Take 81 mg  by mouth.       . Cholecalciferol (VITAMIN D) 2000 UNITS CAPS Take 1 capsule by mouth daily.        . Coenzyme Q10 (CO Q 10) 100 MG CAPS Take 100 mg by mouth daily.        . fenofibrate 160 MG tablet TAKE 1 TABLET DAILY  90 tablet  0  . fish oil-omega-3 fatty acids 1000 MG capsule Take 2 g by mouth 2 (two) times daily.       Marland Kitchen losartan (COZAAR) 50 MG tablet Take 1 tablet (50 mg total) by mouth daily.  30 tablet  0  . Multiple Vitamin (MULTIVITAMIN) tablet Take 1 tablet by mouth daily.        . simvastatin (ZOCOR) 40 MG tablet TAKE 1 TABLET AT BEDTIME  90 tablet  0  . vitamin E 100 UNIT capsule Take 200 Units by mouth daily.       . [DISCONTINUED] WELCHOL 625 MG tablet Take 1,250 mg by mouth 2 (two) times daily with a meal.        No current facility-administered medications for this visit.    Allergies  Review of patient's allergies indicates no known allergies.  Electrocardiogram:  3.12  NSR rate 72 normal today no change NSR  Rate 86 PVC normal ST;s   Assessment and Plan

## 2014-06-16 NOTE — Assessment & Plan Note (Signed)
Well controlled.  Continue current medications and low sodium Dash type diet.    

## 2014-06-16 NOTE — Assessment & Plan Note (Signed)
Cholesterol is at goal.  Continue current dose of statin and diet Rx.  No myalgias or side effects.  F/U  LFT's in 6 months. No results found for this basename: [place] checked at [facility] LDL under 100

## 2014-06-16 NOTE — Assessment & Plan Note (Signed)
Chest pain with some atypical features  F/U ETT  Has nitro if he needs it

## 2014-06-17 ENCOUNTER — Ambulatory Visit: Payer: BC Managed Care – PPO | Admitting: Cardiology

## 2014-06-22 ENCOUNTER — Ambulatory Visit (HOSPITAL_COMMUNITY)
Admission: RE | Admit: 2014-06-22 | Discharge: 2014-06-22 | Disposition: A | Discharge status: Home / Self Care | Payer: BC Managed Care – PPO | Source: Ambulatory Visit | Attending: Cardiovascular Disease | Admitting: Cardiovascular Disease

## 2014-06-22 ENCOUNTER — Encounter: Payer: Self-pay | Admitting: Cardiology

## 2014-06-22 ENCOUNTER — Encounter (HOSPITAL_COMMUNITY): Payer: Self-pay

## 2014-06-22 DIAGNOSIS — I2510 Atherosclerotic heart disease of native coronary artery without angina pectoris: Secondary | ICD-10-CM | POA: Diagnosis not present

## 2014-06-22 DIAGNOSIS — R079 Chest pain, unspecified: Secondary | ICD-10-CM | POA: Insufficient documentation

## 2014-06-22 NOTE — Progress Notes (Signed)
Stress Lab Nurses Notes - [name]   KEWON STATLER  06/22/2014  Reason for doing test: CAD and Chest Pain  Type of test: Regular GTX  Nurse performing test: Gerrit Halls, RN  MD performing test: Myles Gip /K.Purcell Nails NP  Family MD: McGough  Test explained and consent signed: Yes.  IV started: No IV started  Symptoms: Fatigue & L arm discomfort  Treatment/Intervention: None  Reason test stopped: Fatigue and reached target HR  Patient discharged: Home  Patient's Condition upon discharge was: Stable  Comments: During test peak BP 184/79 & HR 171. Recovery BP 130/98 & HR 97. Continues to have L arm discomfort in recovery.  Geanie Cooley T  Attending note:  Patient exercised on Bruce protocol for 9:57 reaching maximum workload 13.2 METS without chest pain. Peak heart rate 171 BPM, 102% MPHR. Peak blood pressure 184/79. There were no diagnostic ST segment changes to indicate ischemia. Occasional PVCs noted with single couplet. Low risk Duke treadmill score of 10.  Satira Sark, M.D., F.A.C.C.

## 2014-06-22 NOTE — Progress Notes (Signed)
Stress Lab Nurses Notes - [name]  Jeff Prince 06/22/2014 Reason for doing test: CAD and Chest Pain Type of test: Regular GTX Nurse performing test: Gerrit Halls, RN Nuclear Medicine Tech: Not Applicable Echo Tech: Not Applicable MD performing test: Myles Gip /K.Purcell Nails NP Family MD: McGough Test explained and consent signed: Yes.   IV started: No IV started Symptoms: Fatigue & L arm discomfort Treatment/Intervention: None Reason test stopped: fatigue and reached target HR After recovery IV was: NA Patient to return to N[facility]. Med at : NA Patient discharged: Home Patient's Condition upon discharge was: stable Comments: During test peak BP 184/79 & HR 171.  Recovery BP 130/98 & HR 97.  Continues to have L arm discomfort  in recovery. Geanie Cooley T

## 2014-06-29 NOTE — Progress Notes (Signed)
Normal ETT 

## 2014-06-29 NOTE — Progress Notes (Signed)
Patient ID: Jeff Prince, male   DOB: [birth year], 53 y.o.   MRN: [id number] Discussed results of testing with wife as pt was at work.  She will make him aware of the results

## 2014-08-03 ENCOUNTER — Encounter: Payer: Self-pay | Admitting: Cardiovascular Disease

## 2014-08-04 ENCOUNTER — Telehealth: Payer: Self-pay | Admitting: *Deleted

## 2014-08-04 NOTE — Telephone Encounter (Signed)
         Will Bonnet ','<More Detail >>       Josue Hector, MD       Sent: Tue August 03, 2014 4:47 PM    To: Richmond Campbell, LPN                   Message     LFTls normal LDL ok         ----- Message -----    From: Elberta Fortis    Sent: 08/03/2014 3:21 PM    To: Josue Hector, MD                                   File Link     Scan on 08/03/2014 3:19 PM : [place] on 08/03/2014 3:19 PM : [facility]- Lipid Panel          Key Information     Document ID File Type Document Type Description    [id number] Image Lab Result Document [facility] Panel           Import Information     Attached At Date Time User Dept    Order Level 08/03/2014 3:19 PM             Order     Lipid Panel With Ldl/Hdl Ratio [[id number]]          Encounter     Scanned Document on 08/03/14 with Thayer Headings, MD         [facility] .[facility]onis Housekeeper

## 2014-08-05 NOTE — Telephone Encounter (Signed)
Follow up ° ° ° ° ° °Returning Jeff Prince's call °

## 2014-08-05 NOTE — Telephone Encounter (Signed)
I spoke with the pt and made him aware that Dr Johnsie Cancel reviewed his lab results from [facility] and these were okay.

## 2014-12-13 NOTE — Progress Notes (Signed)
Patient ID: Jeff Prince, male   DOB: 1961-04-20, 54 y.o.   MRN: [id number]   Jeff Prince is seen for f/u CAD  He had a DES to the prox LAD  On  10/11. He was readmitted with SSCP about a week after implantation and F/U cath showed a patent stent. He has a significatn panic/anxiety disorder that needs further Rx. I encouraged him to discuss referall by Dr Orson Ape. He denies SSCP since 2nd D/C. He has been compliant with his meds   . All of his caths were done from the right radial with no complicaitons. BP has been running a little high I and last visit he was put on Cozaar Reviewed BP checks from nurse at [facility] and they are in a good range   Had chest pains in August 2015  Some exertional but not all  A week ago had severe pain left arm and pectroral area Of/On all day ? Muscular  Some PT on neck helped a bit This pain was intermitant and non exertional   Son is a Software engineer in [place] and graduated from [employer] in 2013  Having some marital issues and stress Still working at [employer]  ETT 06/22/14 Normal Patient exercised on Bruce protocol for 9:57 reaching maximum workload 13.2 METS without chest pain. Peak heart rate 171 BPM, 102% MPHR. Peak blood pressure 184/79. There were no diagnostic ST segment changes to indicate ischemia. Occasional PVCs noted with single couplet. Low risk Duke treadmill score of 10.  ROS: Denies fever, malais, weight loss, blurry vision, decreased visual acuity, cough, sputum, SOB, hemoptysis, pleuritic pain, palpitaitons, heartburn, abdominal pain, melena, lower extremity edema, claudication, or rash.  All other systems reviewed and negative  General: Affect appropriate Healthy:  appears stated age 66: normal Neck supple with no adenopathy JVP normal no bruits no thyromegaly Lungs clear with no wheezing and good diaphragmatic motion Heart:  S1/S2 no murmur, no rub, gallop or click PMI normal Abdomen: benighn, BS positve, no tenderness, no AAA no bruit.  No HSM or  HJR Distal pulses intact with no bruits No edema Neuro non-focal Skin warm and dry No muscular weakness   Current Outpatient Prescriptions  Medication Sig Dispense Refill  . ALPRAZolam (XANAX) 0.5 MG tablet Take 0.5 mg by mouth at bedtime as needed. Sleep/Anxiety    . Ascorbic Acid (VITAMIN C) 500 MG tablet Take 500 mg by mouth daily.      Marland Kitchen aspirin 81 MG tablet Take 81 mg by mouth.     . Cholecalciferol (VITAMIN D) 2000 UNITS CAPS Take 1 capsule by mouth daily.      . Coenzyme Q10 (CO Q 10) 100 MG CAPS Take 100 mg by mouth daily.      . CRESTOR 20 MG tablet Take 20 mg by mouth daily.     . fenofibrate 160 MG tablet TAKE 1 TABLET DAILY 90 tablet 0  . fish oil-omega-3 fatty acids 1000 MG capsule Take 2 g by mouth 2 (two) times daily.     Marland Kitchen losartan (COZAAR) 50 MG tablet Take 1 tablet (50 mg total) by mouth daily. 30 tablet 0  . Multiple Vitamin (MULTIVITAMIN) tablet Take 1 tablet by mouth daily.      . vitamin E 100 UNIT capsule Take 200 Units by mouth daily.     . [DISCONTINUED] WELCHOL 625 MG tablet Take 1,250 mg by mouth 2 (two) times daily with a meal.      No current facility-administered medications for this visit.  Allergies  Review of patient's allergies indicates no known allergies.  Electrocardiogram:  3.12 . 15   NSR rate 72 normal 7/15  no change NSR  Rate 86 PVC normal ST;s   Assessment and Plan

## 2014-12-15 ENCOUNTER — Encounter: Payer: Self-pay | Admitting: Cardiovascular Disease

## 2014-12-15 ENCOUNTER — Ambulatory Visit (INDEPENDENT_AMBULATORY_CARE_PROVIDER_SITE_OTHER): Payer: BLUE CROSS/BLUE SHIELD | Admitting: Cardiovascular Disease

## 2014-12-15 VITALS — BP 135/80 | HR 80 | Ht 72.0 in | Wt 189.8 lb

## 2014-12-15 DIAGNOSIS — F419 Anxiety disorder, unspecified: Secondary | ICD-10-CM

## 2014-12-15 DIAGNOSIS — I10 Essential (primary) hypertension: Secondary | ICD-10-CM

## 2014-12-15 DIAGNOSIS — E785 Hyperlipidemia, unspecified: Secondary | ICD-10-CM

## 2014-12-15 MED ORDER — FENOFIBRATE 160 MG PO TABS: 160.0000 mg | ORAL_TABLET | Freq: Every day | ORAL | Status: DC

## 2014-12-15 MED ORDER — LOSARTAN POTASSIUM 50 MG PO TABS: 50.0000 mg | ORAL_TABLET | Freq: Every day | ORAL | Status: DC

## 2014-12-15 MED ORDER — ROSUVASTATIN CALCIUM 20 MG PO TABS: 20.0000 mg | ORAL_TABLET | Freq: Every day | ORAL | Status: DC

## 2014-12-15 NOTE — Assessment & Plan Note (Signed)
Cholesterol is at goal.  Continue current dose of statin and diet Rx.  No myalgias or side effects.  F/U  LFT's in 6 months. No results found for: [facility] Labs with primary

## 2014-12-15 NOTE — Assessment & Plan Note (Signed)
Well controlled.  Continue current medications and low sodium Dash type diet.   Gets it checked at [facility] and has been fine

## 2014-12-15 NOTE — Patient Instructions (Signed)
Your physician wants you to follow-up in:  6 MONTHS WITH DR NISHAN  You will receive a reminder letter in the mail two months in advance. If you don't receive a letter, please call our office to schedule the follow-up appointment. Your physician recommends that you continue on your current medications as directed. Please refer to the Current Medication list given to you today. 

## 2014-12-15 NOTE — Assessment & Plan Note (Signed)
Seems to be improved some marital stress

## 2015-02-21 ENCOUNTER — Encounter: Payer: Self-pay | Admitting: Cardiovascular Disease

## 2015-02-23 ENCOUNTER — Telehealth: Payer: Self-pay | Admitting: Cardiovascular Disease

## 2015-02-23 NOTE — Telephone Encounter (Signed)
New message  ° ° °Patient returning nurse call.   °

## 2015-02-23 NOTE — Telephone Encounter (Signed)
PT AWARE OF LAB RESULTS./CY 

## 2015-09-14 IMAGING — US US ABDOMEN COMPLETE
1 series · 14 of 25 positions shown · non-contrast
Comparison: None.

EXAM:
ULTRASOUND ABDOMEN COMPLETE

[Series 1: us abdomen complete · 0.21mm/px · 14 of 142 slices shown]
[im 1/142]
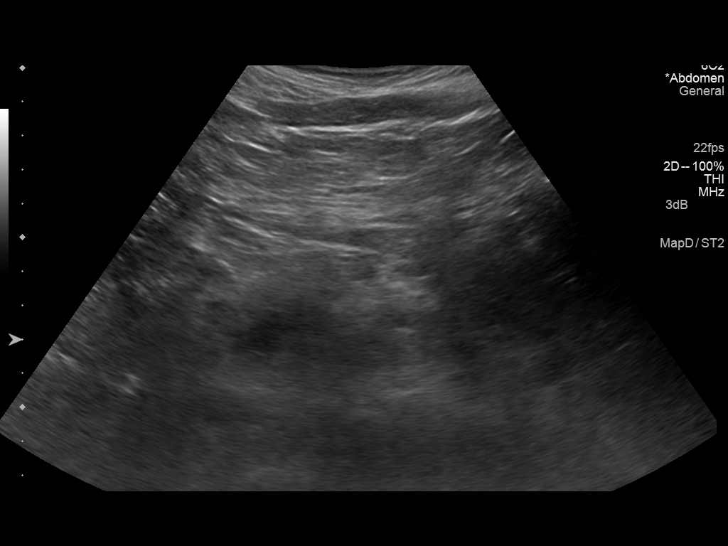
[im 12/142]
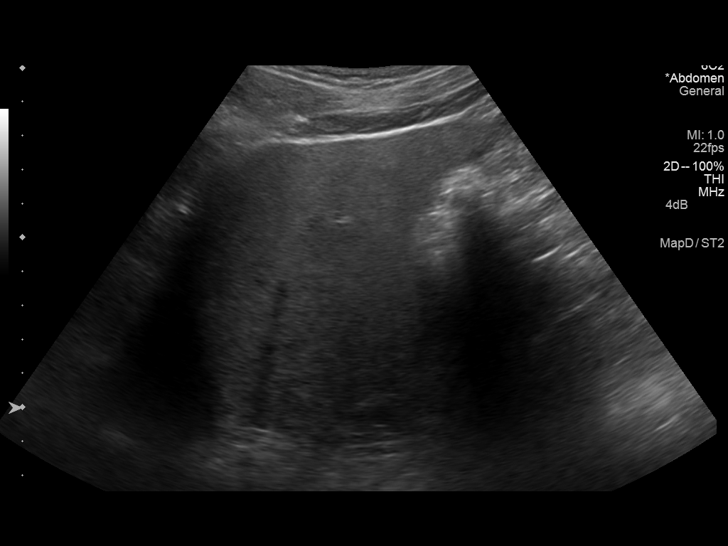
[im 24/142]
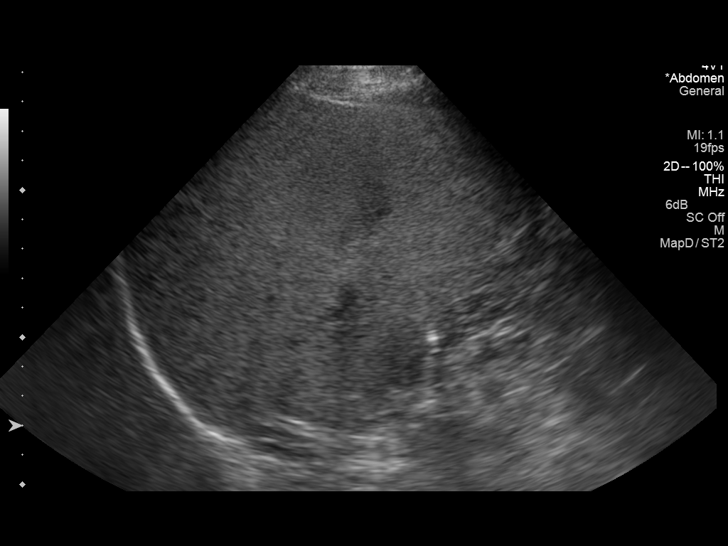
[im 36/142]
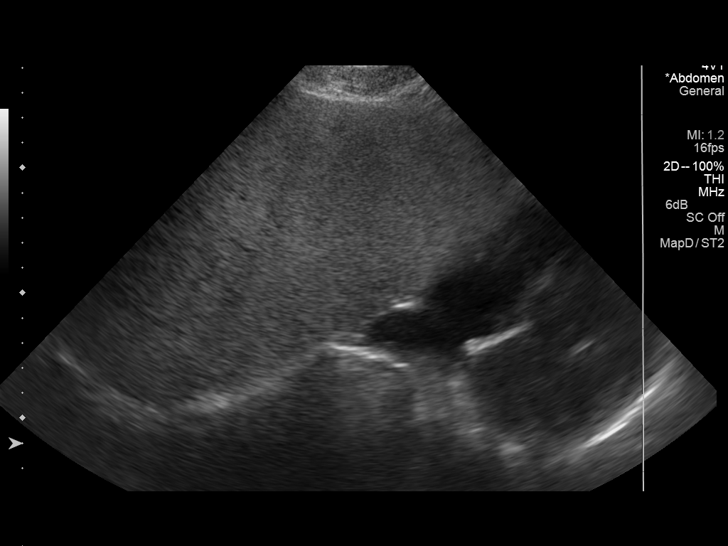
[im 48/142]
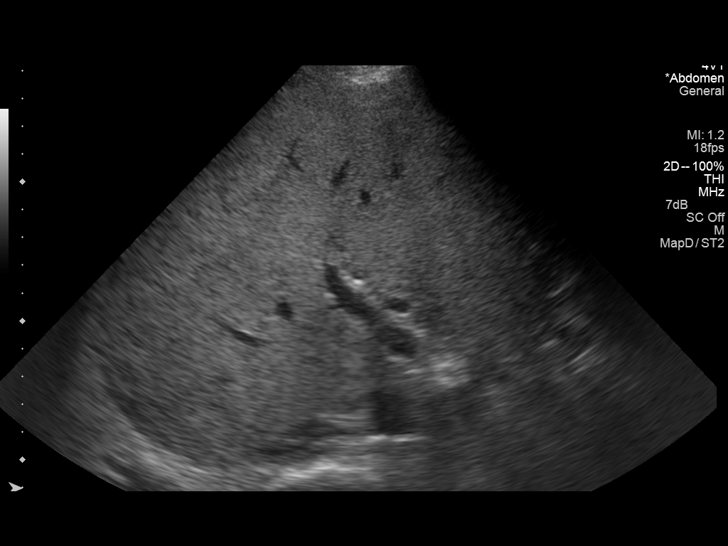
[im 53/142]
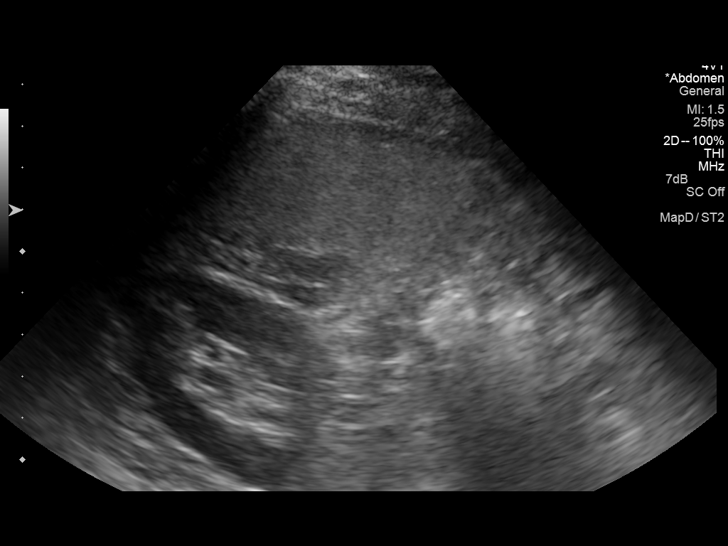
[im 65/142]
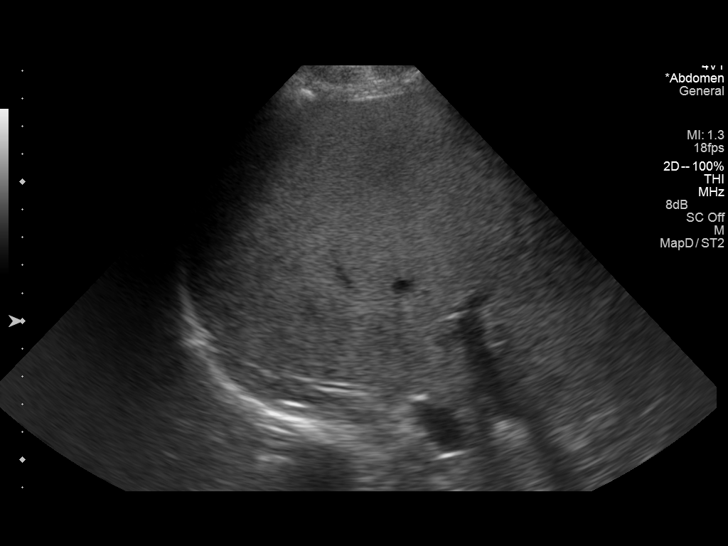
[im 77/142]
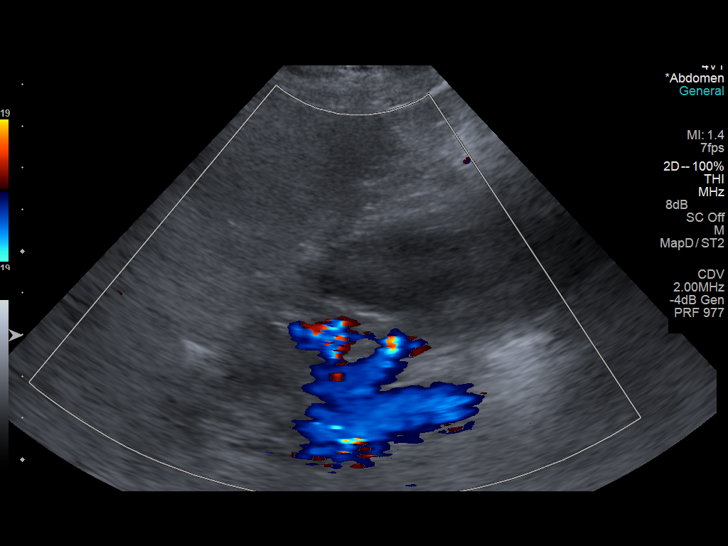
[im 89/142]
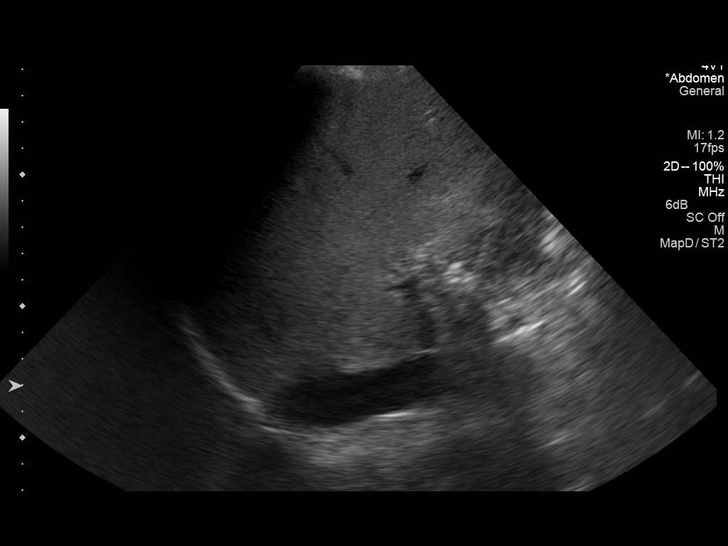
[im 95/142]
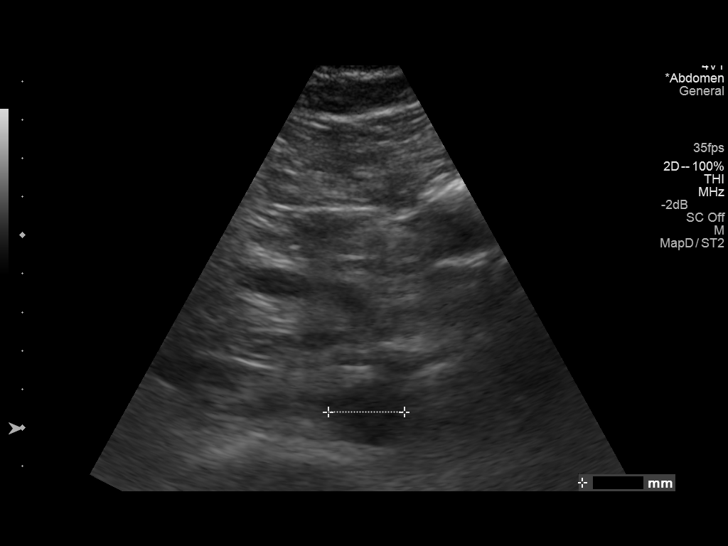
[im 106/142]
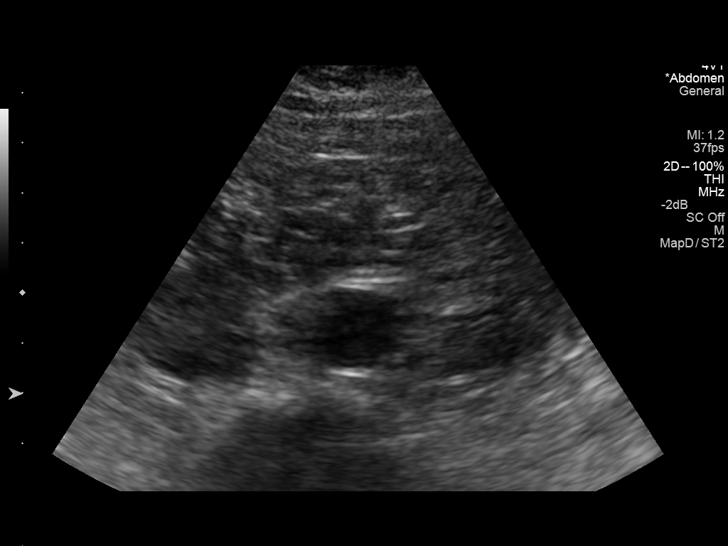
[im 118/142]
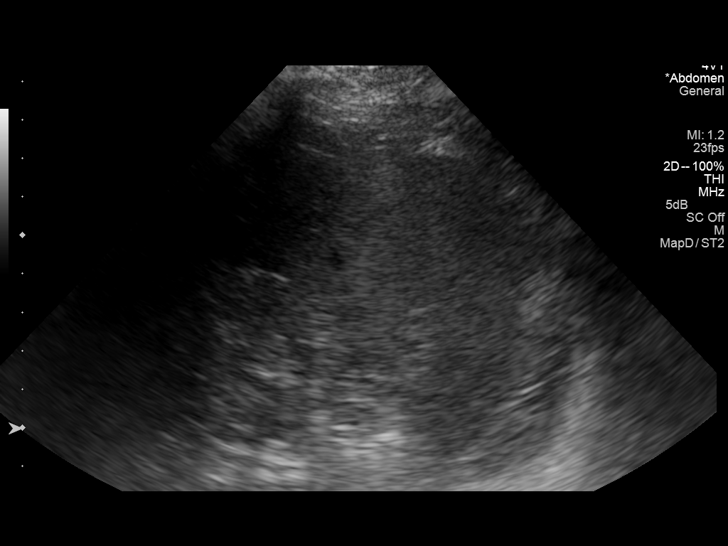
[im 130/142]
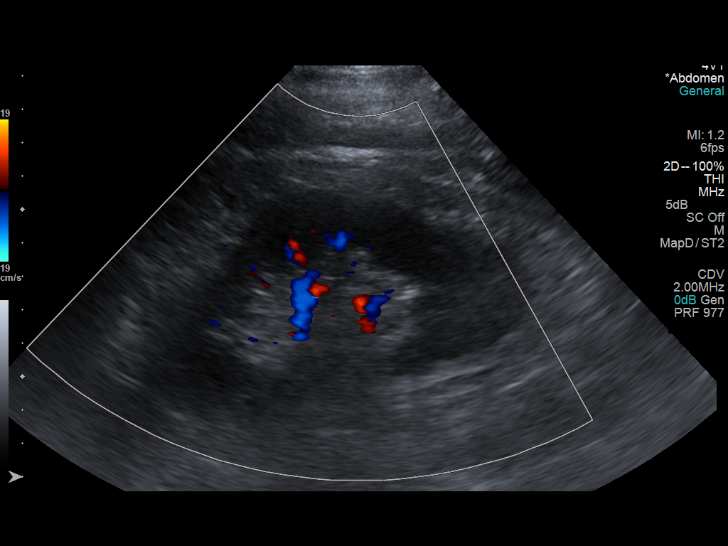
[im 142/142]
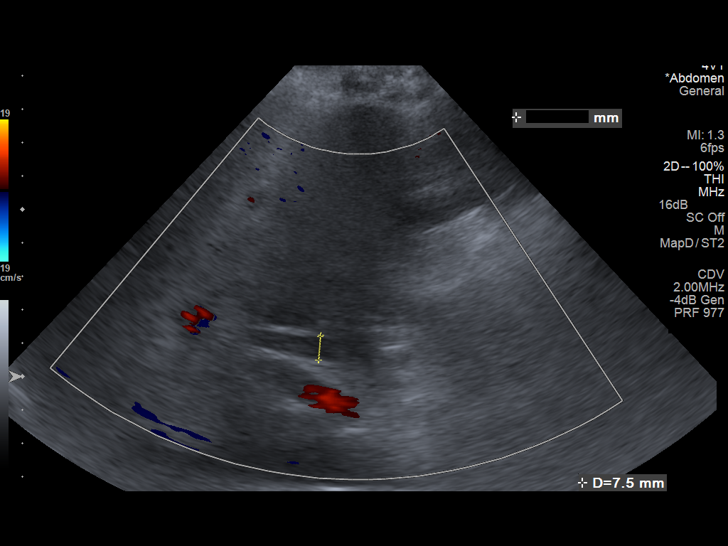

[14 of 25 positions shown; findings below may reference images not displayed]

FINDINGS: Gallbladder:

The gallbladder is surgically absent

Common bile duct:

Diameter: 7 mm

Liver:

No focal lesion identified. There is no intrahepatic ductal
dilation. The echotexture is increased consistent with fatty
infiltration.

IVC:

No abnormality visualized.

Pancreas:

Visualized portion unremarkable.

Spleen:

Size and appearance within normal limits.

Right Kidney:

Length: 12.4 cm. The echogenicity is within normal limits. There is
no mass nor hydronephrosis.

Left Kidney:

Length: 11.7 cm. Echogenicity within normal limits. No mass or
hydronephrosis visualized.

Abdominal aorta:

There is no definite aneurysm but bowel gas and mural calcifications
limits evaluation of the mid and distal aorta.

Other findings:

There is no ascites.
IMPRESSION: 1. There fatty infiltrative changes of the liver.
2. There is no acute abnormality elsewhere within the abdomen.

## 2015-10-06 ENCOUNTER — Encounter: Payer: Self-pay | Admitting: Cardiovascular Disease

## 2015-11-01 ENCOUNTER — Encounter: Payer: Self-pay | Admitting: Cardiovascular Disease

## 2016-02-07 DIAGNOSIS — Z139 Encounter for screening, unspecified: Secondary | ICD-10-CM | POA: Diagnosis not present

## 2016-02-07 DIAGNOSIS — I10 Essential (primary) hypertension: Secondary | ICD-10-CM | POA: Diagnosis not present

## 2016-02-07 DIAGNOSIS — E559 Vitamin D deficiency, unspecified: Secondary | ICD-10-CM | POA: Diagnosis not present

## 2016-02-07 DIAGNOSIS — I2510 Atherosclerotic heart disease of native coronary artery without angina pectoris: Secondary | ICD-10-CM | POA: Diagnosis not present

## 2016-02-07 DIAGNOSIS — R7301 Impaired fasting glucose: Secondary | ICD-10-CM | POA: Diagnosis not present

## 2016-02-07 DIAGNOSIS — E782 Mixed hyperlipidemia: Secondary | ICD-10-CM | POA: Diagnosis not present

## 2016-02-07 DIAGNOSIS — Z79899 Other long term (current) drug therapy: Secondary | ICD-10-CM | POA: Diagnosis not present

## 2016-02-28 DIAGNOSIS — E782 Mixed hyperlipidemia: Secondary | ICD-10-CM | POA: Diagnosis not present

## 2016-02-28 DIAGNOSIS — I10 Essential (primary) hypertension: Secondary | ICD-10-CM | POA: Diagnosis not present

## 2016-02-28 DIAGNOSIS — R7301 Impaired fasting glucose: Secondary | ICD-10-CM | POA: Diagnosis not present

## 2016-02-28 DIAGNOSIS — I2510 Atherosclerotic heart disease of native coronary artery without angina pectoris: Secondary | ICD-10-CM | POA: Diagnosis not present

## 2016-05-15 DIAGNOSIS — Z79899 Other long term (current) drug therapy: Secondary | ICD-10-CM | POA: Diagnosis not present

## 2016-05-15 DIAGNOSIS — I10 Essential (primary) hypertension: Secondary | ICD-10-CM | POA: Diagnosis not present

## 2016-05-15 DIAGNOSIS — E559 Vitamin D deficiency, unspecified: Secondary | ICD-10-CM | POA: Diagnosis not present

## 2016-05-15 DIAGNOSIS — I2510 Atherosclerotic heart disease of native coronary artery without angina pectoris: Secondary | ICD-10-CM | POA: Diagnosis not present

## 2016-05-15 DIAGNOSIS — E782 Mixed hyperlipidemia: Secondary | ICD-10-CM | POA: Diagnosis not present

## 2016-05-15 DIAGNOSIS — R7301 Impaired fasting glucose: Secondary | ICD-10-CM | POA: Diagnosis not present

## 2016-06-05 DIAGNOSIS — R7989 Other specified abnormal findings of blood chemistry: Secondary | ICD-10-CM | POA: Diagnosis not present

## 2016-06-05 DIAGNOSIS — E559 Vitamin D deficiency, unspecified: Secondary | ICD-10-CM | POA: Diagnosis not present

## 2016-06-05 DIAGNOSIS — E782 Mixed hyperlipidemia: Secondary | ICD-10-CM | POA: Diagnosis not present

## 2016-07-24 ENCOUNTER — Encounter: Payer: Self-pay | Admitting: Cardiovascular Disease

## 2016-07-24 DIAGNOSIS — Z139 Encounter for screening, unspecified: Secondary | ICD-10-CM | POA: Diagnosis not present

## 2016-07-24 DIAGNOSIS — Z79899 Other long term (current) drug therapy: Secondary | ICD-10-CM | POA: Diagnosis not present

## 2016-07-24 DIAGNOSIS — E782 Mixed hyperlipidemia: Secondary | ICD-10-CM | POA: Diagnosis not present

## 2016-07-24 DIAGNOSIS — E559 Vitamin D deficiency, unspecified: Secondary | ICD-10-CM | POA: Diagnosis not present

## 2016-07-31 DIAGNOSIS — E782 Mixed hyperlipidemia: Secondary | ICD-10-CM | POA: Diagnosis not present

## 2016-07-31 DIAGNOSIS — I10 Essential (primary) hypertension: Secondary | ICD-10-CM | POA: Diagnosis not present

## 2016-07-31 DIAGNOSIS — R7989 Other specified abnormal findings of blood chemistry: Secondary | ICD-10-CM | POA: Diagnosis not present

## 2016-07-31 DIAGNOSIS — E559 Vitamin D deficiency, unspecified: Secondary | ICD-10-CM | POA: Diagnosis not present

## 2016-08-07 DIAGNOSIS — Z23 Encounter for immunization: Secondary | ICD-10-CM | POA: Diagnosis not present

## 2016-10-09 DIAGNOSIS — R7301 Impaired fasting glucose: Secondary | ICD-10-CM | POA: Diagnosis not present

## 2016-10-09 DIAGNOSIS — I10 Essential (primary) hypertension: Secondary | ICD-10-CM | POA: Diagnosis not present

## 2016-10-09 DIAGNOSIS — Z79899 Other long term (current) drug therapy: Secondary | ICD-10-CM | POA: Diagnosis not present

## 2016-10-09 DIAGNOSIS — I2510 Atherosclerotic heart disease of native coronary artery without angina pectoris: Secondary | ICD-10-CM | POA: Diagnosis not present

## 2016-10-09 DIAGNOSIS — E782 Mixed hyperlipidemia: Secondary | ICD-10-CM | POA: Diagnosis not present

## 2016-10-17 DIAGNOSIS — R7301 Impaired fasting glucose: Secondary | ICD-10-CM | POA: Diagnosis not present

## 2016-10-17 DIAGNOSIS — I10 Essential (primary) hypertension: Secondary | ICD-10-CM | POA: Diagnosis not present

## 2016-10-17 DIAGNOSIS — E782 Mixed hyperlipidemia: Secondary | ICD-10-CM | POA: Diagnosis not present

## 2016-11-27 DIAGNOSIS — I10 Essential (primary) hypertension: Secondary | ICD-10-CM | POA: Diagnosis not present

## 2016-11-27 DIAGNOSIS — R7301 Impaired fasting glucose: Secondary | ICD-10-CM | POA: Diagnosis not present

## 2016-11-27 DIAGNOSIS — E782 Mixed hyperlipidemia: Secondary | ICD-10-CM | POA: Diagnosis not present

## 2016-11-27 DIAGNOSIS — I2510 Atherosclerotic heart disease of native coronary artery without angina pectoris: Secondary | ICD-10-CM | POA: Diagnosis not present

## 2016-12-10 DIAGNOSIS — I2510 Atherosclerotic heart disease of native coronary artery without angina pectoris: Secondary | ICD-10-CM | POA: Diagnosis not present

## 2016-12-10 DIAGNOSIS — Z1389 Encounter for screening for other disorder: Secondary | ICD-10-CM | POA: Diagnosis not present

## 2016-12-10 DIAGNOSIS — I10 Essential (primary) hypertension: Secondary | ICD-10-CM | POA: Diagnosis not present

## 2016-12-10 DIAGNOSIS — Z6824 Body mass index (BMI) 24.0-24.9, adult: Secondary | ICD-10-CM | POA: Diagnosis not present

## 2016-12-10 DIAGNOSIS — R7309 Other abnormal glucose: Secondary | ICD-10-CM | POA: Diagnosis not present

## 2016-12-10 DIAGNOSIS — E785 Hyperlipidemia, unspecified: Secondary | ICD-10-CM | POA: Diagnosis not present

## 2016-12-13 DIAGNOSIS — Z1389 Encounter for screening for other disorder: Secondary | ICD-10-CM | POA: Diagnosis not present

## 2016-12-13 DIAGNOSIS — I10 Essential (primary) hypertension: Secondary | ICD-10-CM | POA: Diagnosis not present

## 2016-12-13 DIAGNOSIS — E785 Hyperlipidemia, unspecified: Secondary | ICD-10-CM | POA: Diagnosis not present

## 2016-12-13 DIAGNOSIS — I2510 Atherosclerotic heart disease of native coronary artery without angina pectoris: Secondary | ICD-10-CM | POA: Diagnosis not present

## 2017-02-05 DIAGNOSIS — Z008 Encounter for other general examination: Secondary | ICD-10-CM | POA: Diagnosis not present

## 2017-02-05 DIAGNOSIS — R7301 Impaired fasting glucose: Secondary | ICD-10-CM | POA: Diagnosis not present

## 2017-02-05 DIAGNOSIS — I10 Essential (primary) hypertension: Secondary | ICD-10-CM | POA: Diagnosis not present

## 2017-02-05 DIAGNOSIS — Z719 Counseling, unspecified: Secondary | ICD-10-CM | POA: Diagnosis not present

## 2017-02-05 DIAGNOSIS — E782 Mixed hyperlipidemia: Secondary | ICD-10-CM | POA: Diagnosis not present

## 2017-02-13 DIAGNOSIS — I2510 Atherosclerotic heart disease of native coronary artery without angina pectoris: Secondary | ICD-10-CM | POA: Diagnosis not present

## 2017-02-13 DIAGNOSIS — Z6825 Body mass index (BMI) 25.0-25.9, adult: Secondary | ICD-10-CM | POA: Diagnosis not present

## 2017-02-13 DIAGNOSIS — E782 Mixed hyperlipidemia: Secondary | ICD-10-CM | POA: Diagnosis not present

## 2017-02-13 DIAGNOSIS — R7309 Other abnormal glucose: Secondary | ICD-10-CM | POA: Diagnosis not present

## 2017-05-28 DIAGNOSIS — E559 Vitamin D deficiency, unspecified: Secondary | ICD-10-CM | POA: Diagnosis not present

## 2017-05-28 DIAGNOSIS — Z139 Encounter for screening, unspecified: Secondary | ICD-10-CM | POA: Diagnosis not present

## 2017-05-28 DIAGNOSIS — Z79899 Other long term (current) drug therapy: Secondary | ICD-10-CM | POA: Diagnosis not present

## 2017-05-28 DIAGNOSIS — E782 Mixed hyperlipidemia: Secondary | ICD-10-CM | POA: Diagnosis not present

## 2017-06-05 DIAGNOSIS — Z719 Counseling, unspecified: Secondary | ICD-10-CM | POA: Diagnosis not present

## 2017-06-05 DIAGNOSIS — R7301 Impaired fasting glucose: Secondary | ICD-10-CM | POA: Diagnosis not present

## 2017-06-05 DIAGNOSIS — Z008 Encounter for other general examination: Secondary | ICD-10-CM | POA: Diagnosis not present

## 2017-06-05 DIAGNOSIS — I10 Essential (primary) hypertension: Secondary | ICD-10-CM | POA: Diagnosis not present

## 2017-06-05 DIAGNOSIS — E782 Mixed hyperlipidemia: Secondary | ICD-10-CM | POA: Diagnosis not present

## 2017-07-12 DIAGNOSIS — H52223 Regular astigmatism, bilateral: Secondary | ICD-10-CM | POA: Diagnosis not present

## 2017-07-12 DIAGNOSIS — H5203 Hypermetropia, bilateral: Secondary | ICD-10-CM | POA: Diagnosis not present

## 2017-07-12 DIAGNOSIS — H524 Presbyopia: Secondary | ICD-10-CM | POA: Diagnosis not present

## 2017-07-29 DIAGNOSIS — R7309 Other abnormal glucose: Secondary | ICD-10-CM | POA: Diagnosis not present

## 2017-07-29 DIAGNOSIS — E663 Overweight: Secondary | ICD-10-CM | POA: Diagnosis not present

## 2017-07-29 DIAGNOSIS — E781 Pure hyperglyceridemia: Secondary | ICD-10-CM | POA: Diagnosis not present

## 2017-07-29 DIAGNOSIS — Z1389 Encounter for screening for other disorder: Secondary | ICD-10-CM | POA: Diagnosis not present

## 2017-07-29 DIAGNOSIS — I2510 Atherosclerotic heart disease of native coronary artery without angina pectoris: Secondary | ICD-10-CM | POA: Diagnosis not present

## 2017-07-29 DIAGNOSIS — I10 Essential (primary) hypertension: Secondary | ICD-10-CM | POA: Diagnosis not present

## 2017-07-29 DIAGNOSIS — Z6825 Body mass index (BMI) 25.0-25.9, adult: Secondary | ICD-10-CM | POA: Diagnosis not present

## 2017-08-15 ENCOUNTER — Encounter: Payer: Self-pay | Admitting: *Deleted

## 2017-08-27 DIAGNOSIS — E782 Mixed hyperlipidemia: Secondary | ICD-10-CM | POA: Diagnosis not present

## 2017-08-27 DIAGNOSIS — E559 Vitamin D deficiency, unspecified: Secondary | ICD-10-CM | POA: Diagnosis not present

## 2017-08-27 DIAGNOSIS — Z79899 Other long term (current) drug therapy: Secondary | ICD-10-CM | POA: Diagnosis not present

## 2017-08-29 NOTE — Progress Notes (Signed)
Cardiology Office Note   Date:  08/30/2017   ID:  Jeff Prince, DOB 17-Jun-1961, MRN [id number]  PCP:  Sharilyn Sites, MD  Cardiologist:  Dr. Johnsie Cancel     Chief Complaint  Patient presents with  . Coronary Artery Disease    CP comes and goes,       History of Present Illness: Jeff Prince is a 56 y.o. male who presents for chest pain.  He had a DES to the prox LAD  On  10/11. He was readmitted with SSCP about a week after implantation and F/U cath showed a patent stent. He has a significatn panic/anxiety disorder that needs further Rx.  Followed by Dr Orson Ape. He denied SSCP since 2nd D/C. He has been compliant with his meds   RTT 06/22/14 without ischemia .  Low risk test. Last saw Dr. Johnsie Cancel 2016.   He had been doing great without any problems, PCP follows cholesterol which is controlled but TG elevated 05/28/17 at 1081.  LDL was 67 HDL 32 and A1C was 5.9.  He is on crestor 20, lovaza, 2 gm BID, and fenofibrate 160 mg.    Today he presents with somewhat atypical Lt lateral chest pain.  Occurs with exertion though he believes it may be upper arm movement.  It has wakened him from sleep - he believes due to position, he turns over and it will ease off. No associated nausea or vomiting, no diaphoresis and no SOB.     Occurs once a week or so.  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  + strong family hx of CAD on his father's side.       Past Medical History:  Diagnosis Date  . Anxiety   . CAD (coronary artery disease)   . HLD (hyperlipidemia)   . HTN (hypertension)     Past Surgical History:  Procedure Laterality Date  . BILE DUCT STENT PLACEMENT    . CARDIAC CATHETERIZATION  2010   [facility]  . CHOLECYSTECTOMY  1997   with stent, RMR  . CORONARY STENT PLACEMENT    . FLEXIBLE SIGMOIDOSCOPY     at age of 43 in [place]     Current Outpatient Prescriptions  Medication Sig Dispense  Refill  . ALPRAZolam (XANAX) 0.5 MG tablet Take 0.5 mg by mouth at bedtime as needed. Sleep/Anxiety    . Ascorbic Acid (VITAMIN C) 500 MG tablet Take 500 mg by mouth daily.      Marland Kitchen aspirin 81 MG tablet Take 81 mg by mouth.     . Cholecalciferol (VITAMIN D) 2000 UNITS CAPS Take 1 capsule by mouth daily.      . Coenzyme Q10 (CO Q 10) 100 MG CAPS Take 100 mg by mouth daily.      . fenofibrate 160 MG tablet Take 1 tablet (160 mg total) by mouth daily. 90 tablet 3  . fish oil-omega-3 fatty acids 1000 MG capsule Take 2 g by mouth 2 (two) times daily.     Marland Kitchen losartan (COZAAR) 50 MG tablet Take 1 tablet (50 mg total) by mouth daily. 90 tablet 3  . Multiple Vitamin (MULTIVITAMIN) tablet Take 1 tablet by mouth daily.      . rosuvastatin (CRESTOR) 20 MG tablet Take 1 tablet (20 mg total) by mouth daily. 90 tablet 3  . vitamin E 100 UNIT capsule Take 200 Units by mouth daily.  No current facility-administered medications for this visit.     Allergies:   Patient has no known allergies.    Social History:  The patient  reports that he has never smoked. He has never used smokeless tobacco. He reports that he drinks alcohol. He reports that he does not use drugs.   Family History:  The patient's family history includes Cancer in his father; Diabetes in his mother; Heart attack in his paternal grandmother and paternal uncle; Hyperlipidemia in his brother, brother, and sister.    ROS:  General:no colds or fevers, no weight changes Skin:no rashes or ulcers HEENT:no blurred vision, no congestion CV:see HPI PUL:see HPI GI:no diarrhea constipation or melena, no indigestion GU:no hematuria, no dysuria MS:no joint pain, no claudication Neuro:no syncope, no lightheadedness Endo:no diabetes, no thyroid disease  Wt Readings from Last 3 Encounters:  08/30/17 184 lb (83.5 kg)  12/15/14 189 lb 12.8 oz (86.1 kg)  06/16/14 181 lb (82.1 kg)     PHYSICAL EXAM: VS:  BP (!) 156/90   Pulse 79   Resp 16    Ht 6' (1.829 m)   Wt 184 lb (83.5 kg)   SpO2 97%   BMI 24.95 kg/m  , BMI Body mass index is 24.95 kg/m. General:Pleasant affect, NAD Skin:Warm and dry, brisk capillary refill HEENT:normocephalic, sclera clear, mucus membranes moist Neck:supple, no JVD, no bruits  Heart:S1S2 RRR without murmur, gallup, rub or click Lungs:clear without rales, rhonchi, or wheezes ZDG:UYQI, non tender, + BS, do not palpate liver spleen or masses Ext:no lower ext edema, 2+ pedal pulses, 2+ radial pulses Neuro:alert and oriented, MAE, follows commands, + facial symmetry    EKG:  EKG is ordered today. The ekg ordered today demonstrates SR with occ PVC no ischemic changes.  Has had PVCs on old EKGs   Recent Labs: 08/30/2017: BUN 14; Creatinine, Ser 1.04; Hemoglobin 15.2; Platelets 211; Potassium 4.2; Sodium 140    Lipid Panel No results found for: CHOL, TRIG, HDL, CHOLHDL, VLDL, LDLCALC, LDLDIRECT     Other studies Reviewed: Additional studies/ records that were reviewed today include: . ETT 06/22/14 Normal Patient exercised on Bruce protocol for 9:57 reaching maximum workload 13.2 METS without chest pain. Peak heart rate 171 BPM, 102% MPHR. Peak blood pressure 184/79. There were no diagnostic ST segment changes to indicate ischemia. Occasional PVCs noted with single couplet. Low risk Duke treadmill score of 10.  ASSESSMENT AND PLAN:  1.  Chest pain somewhat atypical but with his hx of CAD and Stent and his FH of CAD I discussed with Dr. Johnsie Cancel and we will plan cardiac cath.  Pt is agreeable.  The patient understands that risks included but are not limited to stroke (1 in 1000), death (1 in 16), kidney failure [usually temporary] (1 in 500), bleeding (1 in 200), allergic reaction [possibly serious] (1 in 200).   2.  CAD with hx of stent to LAD.  In 2008  3.  HLD with controlled LDL but elevated TG  4.  HTN controlled     Current medicines are reviewed with the patient today.  The patient Has  no concerns regarding medicines.  The following changes have been made:  See above Labs/ tests ordered today include:see above  Disposition:   FU:  see above  Signed, Cecilie Kicks, NP  08/30/2017 6:51 PM    [place], [place], [place], [place] Phone: [phone]; Fax: [phone]  [phone]  [phone] 

## 2017-08-29 NOTE — H&P (View-Only) (Signed)
Cardiology Office Note   Date:  08/30/2017   ID:  Jeff Prince, DOB [birth year], MRN [id number]  PCP:  Jeff Sites, MD  Cardiologist:  Dr. Johnsie Cancel     Chief Complaint  Patient presents with  . Coronary Artery Disease    CP comes and goes,       History of Present Illness: Jeff Prince is a 56 y.o. male who presents for chest pain.  He had a DES to the prox LAD  On  10/11. He was readmitted with SSCP about a week after implantation and F/U cath showed a patent stent. He has a significatn panic/anxiety disorder that needs further Rx.  Followed by Dr Jeff Prince. He denied SSCP since 2nd D/C. He has been compliant with his meds   RTT 06/22/14 without ischemia .  Low risk test. Last saw Dr. Johnsie Cancel 2016.   He had been doing great without any problems, PCP follows cholesterol which is controlled but TG elevated 05/28/17 at 1081.  LDL was 67 HDL 32 and A1C was 5.9.  He is on crestor 20, lovaza, 2 gm BID, and fenofibrate 160 mg.    Today he presents with somewhat atypical Lt lateral chest pain.  Occurs with exertion though he believes it may be upper arm movement.  It has wakened him from sleep - he believes due to position, he turns over and it will ease off. No associated nausea or vomiting, no diaphoresis and no SOB.     Occurs once a week or so.  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  + strong family hx of CAD on his father's side.       Past Medical History:  Diagnosis Date  . Anxiety   . CAD (coronary artery disease)   . HLD (hyperlipidemia)   . HTN (hypertension)     Past Surgical History:  Procedure Laterality Date  . BILE DUCT STENT PLACEMENT    . CARDIAC CATHETERIZATION  2010   [facility]  . CHOLECYSTECTOMY  1997   with stent, RMR  . CORONARY STENT PLACEMENT    . FLEXIBLE SIGMOIDOSCOPY     at age of 59 in [place]     Current Outpatient Prescriptions  Medication Sig Dispense  Refill  . ALPRAZolam (XANAX) 0.5 MG tablet Take 0.5 mg by mouth at bedtime as needed. Sleep/Anxiety    . Ascorbic Acid (VITAMIN C) 500 MG tablet Take 500 mg by mouth daily.      Marland Kitchen aspirin 81 MG tablet Take 81 mg by mouth.     . Cholecalciferol (VITAMIN D) 2000 UNITS CAPS Take 1 capsule by mouth daily.      . Coenzyme Q10 (CO Q 10) 100 MG CAPS Take 100 mg by mouth daily.      . fenofibrate 160 MG tablet Take 1 tablet (160 mg total) by mouth daily. 90 tablet 3  . fish oil-omega-3 fatty acids 1000 MG capsule Take 2 g by mouth 2 (two) times daily.     Marland Kitchen losartan (COZAAR) 50 MG tablet Take 1 tablet (50 mg total) by mouth daily. 90 tablet 3  . Multiple Vitamin (MULTIVITAMIN) tablet Take 1 tablet by mouth daily.      . rosuvastatin (CRESTOR) 20 MG tablet Take 1 tablet (20 mg total) by mouth daily. 90 tablet 3  . vitamin E 100 UNIT capsule Take 200 Units by mouth daily.  No current facility-administered medications for this visit.     Allergies:   Patient has no known allergies.    Social History:  The patient  reports that he has never smoked. He has never used smokeless tobacco. He reports that he drinks alcohol. He reports that he does not use drugs.   Family History:  The patient's family history includes Cancer in his father; Diabetes in his mother; Heart attack in his paternal grandmother and paternal uncle; Hyperlipidemia in his brother, brother, and sister.    ROS:  General:no colds or fevers, no weight changes Skin:no rashes or ulcers HEENT:no blurred vision, no congestion CV:see HPI PUL:see HPI GI:no diarrhea constipation or melena, no indigestion GU:no hematuria, no dysuria MS:no joint pain, no claudication Neuro:no syncope, no lightheadedness Endo:no diabetes, no thyroid disease  Wt Readings from Last 3 Encounters:  08/30/17 184 lb (83.5 kg)  12/15/14 189 lb 12.8 oz (86.1 kg)  06/16/14 181 lb (82.1 kg)     PHYSICAL EXAM: VS:  BP (!) 156/90   Pulse 79   Resp 16    Ht 6' (1.829 m)   Wt 184 lb (83.5 kg)   SpO2 97%   BMI 24.95 kg/m  , BMI Body mass index is 24.95 kg/m. General:Pleasant affect, NAD Skin:Warm and dry, brisk capillary refill HEENT:normocephalic, sclera clear, mucus membranes moist Neck:supple, no JVD, no bruits  Heart:S1S2 RRR without murmur, gallup, rub or click Lungs:clear without rales, rhonchi, or wheezes IWP:YKDX, non tender, + BS, do not palpate liver spleen or masses Ext:no lower ext edema, 2+ pedal pulses, 2+ radial pulses Neuro:alert and oriented, MAE, follows commands, + facial symmetry    EKG:  EKG is ordered today. The ekg ordered today demonstrates SR with occ PVC no ischemic changes.  Has had PVCs on old EKGs   Recent Labs: 08/30/2017: BUN 14; Creatinine, Ser 1.04; Hemoglobin 15.2; Platelets 211; Potassium 4.2; Sodium 140    Lipid Panel No results found for: CHOL, TRIG, HDL, CHOLHDL, VLDL, LDLCALC, LDLDIRECT     Other studies Reviewed: Additional studies/ records that were reviewed today include: . ETT 06/22/14 Normal Patient exercised on Bruce protocol for 9:57 reaching maximum workload 13.2 METS without chest pain. Peak heart rate 171 BPM, 102% MPHR. Peak blood pressure 184/79. There were no diagnostic ST segment changes to indicate ischemia. Occasional PVCs noted with single couplet. Low risk Duke treadmill score of 10.  ASSESSMENT AND PLAN:  1.  Chest pain somewhat atypical but with his hx of CAD and Stent and his FH of CAD I discussed with Dr. Johnsie Cancel and we will plan cardiac cath.  Pt is agreeable.  The patient understands that risks included but are not limited to stroke (1 in 1000), death (1 in 48), kidney failure [usually temporary] (1 in 500), bleeding (1 in 200), allergic reaction [possibly serious] (1 in 200).   2.  CAD with hx of stent to LAD.  In 2008  3.  HLD with controlled LDL but elevated TG  4.  HTN controlled     Current medicines are reviewed with the patient today.  The patient Has  no concerns regarding medicines.  The following changes have been made:  See above Labs/ tests ordered today include:see above  Disposition:   FU:  see above  Signed, Jeff Kicks, NP  08/30/2017 6:51 PM    [place], [place], [place], [place] Phone: [phone]; Fax: [phone]  [phone]  [phone] 

## 2017-08-30 ENCOUNTER — Encounter: Payer: Self-pay | Admitting: Cardiology

## 2017-08-30 ENCOUNTER — Ambulatory Visit (INDEPENDENT_AMBULATORY_CARE_PROVIDER_SITE_OTHER): Payer: BLUE CROSS/BLUE SHIELD | Admitting: Cardiology

## 2017-08-30 VITALS — BP 156/90 | HR 79 | Resp 16 | Ht 72.0 in | Wt 184.0 lb

## 2017-08-30 DIAGNOSIS — Z01812 Encounter for preprocedural laboratory examination: Secondary | ICD-10-CM | POA: Diagnosis not present

## 2017-08-30 DIAGNOSIS — I10 Essential (primary) hypertension: Secondary | ICD-10-CM

## 2017-08-30 DIAGNOSIS — E782 Mixed hyperlipidemia: Secondary | ICD-10-CM

## 2017-08-30 DIAGNOSIS — I2510 Atherosclerotic heart disease of native coronary artery without angina pectoris: Secondary | ICD-10-CM | POA: Diagnosis not present

## 2017-08-30 LAB — BASIC METABOLIC PANEL
BUN / CREAT RATIO: 13 (ref 9–20)
BUN: 14 mg/dL (ref 6–24)
CHLORIDE: 102 mmol/L (ref 96–106)
CO2: 22 mmol/L (ref 20–29)
Calcium: 9.8 mg/dL (ref 8.7–10.2)
Creatinine, Ser: 1.04 mg/dL (ref 0.76–1.27)
GFR calc non Af Amer: 80 mL/min/{1.73_m2} (ref >59)
GFR, EST AFRICAN AMERICAN: 92 mL/min/{1.73_m2} (ref >59)
Glucose: 97 mg/dL (ref 65–99)
POTASSIUM: 4.2 mmol/L (ref 3.5–5.2)
Sodium: 140 mmol/L (ref 134–144)

## 2017-08-30 LAB — CBC WITH DIFFERENTIAL/PLATELET
BASOS: 1 %
Basophils Absolute: 0.1 10*3/uL (ref 0.0–0.2)
EOS (ABSOLUTE): 0.1 10*3/uL (ref 0.0–0.4)
Eos: 1 %
Hematocrit: 44.4 % (ref 37.5–51.0)
Hemoglobin: 15.2 g/dL (ref 13.0–17.7)
IMMATURE GRANULOCYTES: 0 %
Immature Grans (Abs): 0 10*3/uL (ref 0.0–0.1)
LYMPHS ABS: 2 10*3/uL (ref 0.7–3.1)
Lymphs: 34 %
MCH: 31 pg (ref 26.6–33.0)
MCHC: 34.2 g/dL (ref 31.5–35.7)
MCV: 90 fL (ref 79–97)
MONOS ABS: 0.4 10*3/uL (ref 0.1–0.9)
Monocytes: 7 %
NEUTROS ABS: 3.2 10*3/uL (ref 1.4–7.0)
NEUTROS PCT: 57 %
PLATELETS: 211 10*3/uL (ref 150–379)
RBC: 4.91 x10E6/uL (ref 4.14–5.80)
RDW: 12.7 % (ref 12.3–15.4)
WBC: 5.7 10*3/uL (ref 3.4–10.8)

## 2017-08-30 LAB — PROTIME-INR
INR: 1 (ref 0.8–1.2)
Prothrombin Time: 10.4 s (ref 9.1–12.0)

## 2017-08-30 NOTE — Patient Instructions (Addendum)
Medication Instructions: Your physician recommends that you continue on your current medications as directed. Please refer to the Current Medication list given to you today.  Labwork: Your physician recommends that you have lab work today: BMET, CBC, PT/INR   Procedures/Testing: Your physician has requested that you have a cardiac catheterization. Cardiac catheterization is used to diagnose and/or treat various heart conditions. Doctors may recommend this procedure for a number of different reasons. The most common reason is to evaluate chest pain. Chest pain can be a symptom of coronary artery disease (CAD), and cardiac catheterization can show whether plaque is narrowing or blocking your heart's arteries. This procedure is also used to evaluate the valves, as well as measure the blood flow and oxygen levels in different parts of your heart. For further information please visit [email or web address]. Please follow instruction sheet, as given.  Follow-Up: Your physician recommends that you schedule a follow-up appointment in: 2 WEEKS from 09/03/17 with Dr. Johnsie Cancel or an APP on Dr. Kyla Balzarine Team   If you need a refill on your cardiac medications before your next appointment, please call your pharmacy.     [facility] OFF[facility] [address], [place] [address] [place] [address] Dept: [phone] Loc: [phone]  RION CATALA  08/30/2017  You are scheduled for a Cardiac Catheterization on Tuesday, November 6 with Dr. Larae Grooms.  1. Please arrive at the [facility] (Main Entrance A) at [facility]: [address] [place], [place] [address] at 5:30 AM (two hours before your procedure to ensure your preparation). Free valet parking service is available.   Special note: Every effort is made to have your procedure done on time. Please understand that emergencies sometimes delay scheduled procedures.  2.  Diet: Do not eat or drink anything after midnight prior to your procedure except sips of water to take medications.  3. Labs: You will need to have blood drawn on Friday, November 2 at [facility] at [facility].   4. Medication instructions in preparation for your procedure:  On the morning of your procedure, you may take your Aspirin and any other morning medicines with sips of water.  5. Plan for one night stay--bring personal belongings. 6. Bring a current list of your medications and current insurance cards. 7. You MUST have a responsible person to drive you home. 8. Someone MUST be with you the first 24 hours after you arrive home or your discharge will be delayed. 9. Please wear clothes that are easy to get on and off and wear slip-on shoes.  Thank you for allowing [place] to care for you!   -- [facility]

## 2017-09-02 ENCOUNTER — Telehealth: Payer: Self-pay

## 2017-09-02 NOTE — Telephone Encounter (Signed)
Patient contacted pre-catheterization at [facility] scheduled for:  09/03/2017 @ 0730 Verified arrival time and place:  NT @ 0530 Confirmed AM meds to be taken pre-cath with sip of water: Take ASA Confirmed patient has responsible person to drive home post procedure and observe patient for 24 hours:  yes Addl concerns:  none

## 2017-09-03 ENCOUNTER — Other Ambulatory Visit: Payer: Self-pay

## 2017-09-03 ENCOUNTER — Ambulatory Visit (HOSPITAL_COMMUNITY)
Admission: RE | Admit: 2017-09-03 | Discharge: 2017-09-04 | Disposition: A | Discharge status: Home / Self Care | Payer: BLUE CROSS/BLUE SHIELD | Source: Ambulatory Visit | Attending: Interventional Cardiology | Admitting: Interventional Cardiology

## 2017-09-03 ENCOUNTER — Encounter (HOSPITAL_COMMUNITY): Payer: Self-pay | Admitting: Interventional Cardiology

## 2017-09-03 ENCOUNTER — Encounter (HOSPITAL_COMMUNITY)
Admission: RE | Disposition: A | Discharge status: Home / Self Care | Payer: Self-pay | Source: Ambulatory Visit | Attending: Interventional Cardiology

## 2017-09-03 DIAGNOSIS — Z8249 Family history of ischemic heart disease and other diseases of the circulatory system: Secondary | ICD-10-CM | POA: Diagnosis not present

## 2017-09-03 DIAGNOSIS — E785 Hyperlipidemia, unspecified: Secondary | ICD-10-CM | POA: Diagnosis not present

## 2017-09-03 DIAGNOSIS — F419 Anxiety disorder, unspecified: Secondary | ICD-10-CM | POA: Diagnosis not present

## 2017-09-03 DIAGNOSIS — I2510 Atherosclerotic heart disease of native coronary artery without angina pectoris: Secondary | ICD-10-CM | POA: Diagnosis present

## 2017-09-03 DIAGNOSIS — T82855A Stenosis of coronary artery stent, initial encounter: Secondary | ICD-10-CM | POA: Diagnosis not present

## 2017-09-03 DIAGNOSIS — Z7982 Long term (current) use of aspirin: Secondary | ICD-10-CM | POA: Insufficient documentation

## 2017-09-03 DIAGNOSIS — I25118 Atherosclerotic heart disease of native coronary artery with other forms of angina pectoris: Secondary | ICD-10-CM | POA: Insufficient documentation

## 2017-09-03 DIAGNOSIS — Y831 Surgical operation with implant of artificial internal device as the cause of abnormal reaction of the patient, or of later complication, without mention of misadventure at the time of the procedure: Secondary | ICD-10-CM | POA: Insufficient documentation

## 2017-09-03 DIAGNOSIS — I10 Essential (primary) hypertension: Secondary | ICD-10-CM | POA: Diagnosis not present

## 2017-09-03 DIAGNOSIS — Z955 Presence of coronary angioplasty implant and graft: Secondary | ICD-10-CM

## 2017-09-03 HISTORY — PX: LEFT HEART CATH AND CORONARY ANGIOGRAPHY: CATH118249

## 2017-09-03 HISTORY — PX: CORONARY STENT INTERVENTION: CATH118234

## 2017-09-03 HISTORY — DX: Pneumonia, unspecified organism: J18.9

## 2017-09-03 LAB — POCT ACTIVATED CLOTTING TIME
ACTIVATED CLOTTING TIME: 257 s
ACTIVATED CLOTTING TIME: 329 s
Activated Clotting Time: 246 seconds
Activated Clotting Time: 175 seconds

## 2017-09-03 SURGERY — LEFT HEART CATH AND CORONARY ANGIOGRAPHY
Anesthesia: LOCAL

## 2017-09-03 MED ORDER — MIDAZOLAM HCL 2 MG/2ML IJ SOLN
INTRAMUSCULAR | Status: AC
  Filled 2017-09-03: qty 2

## 2017-09-03 MED ORDER — VERAPAMIL HCL 2.5 MG/ML IV SOLN
INTRAVENOUS | Status: DC | PRN
  Administered 2017-09-03: 10 mL via INTRA_ARTERIAL

## 2017-09-03 MED ORDER — HEPARIN (PORCINE) IN NACL 2-0.9 UNIT/ML-% IJ SOLN
INTRAMUSCULAR | Status: AC | PRN
  Administered 2017-09-03: 1000 mL

## 2017-09-03 MED ORDER — SODIUM CHLORIDE 0.9% FLUSH
3.0000 mL | Freq: Two times a day (BID) | INTRAVENOUS | Status: DC
  Administered 2017-09-03 – 2017-09-04 (×3): 3 mL via INTRAVENOUS

## 2017-09-03 MED ORDER — VERAPAMIL HCL 2.5 MG/ML IV SOLN
INTRAVENOUS | Status: AC
  Filled 2017-09-03: qty 2

## 2017-09-03 MED ORDER — SODIUM CHLORIDE 0.9 % IV SOLN: 250.0000 mL | INTRAVENOUS | Status: DC | PRN

## 2017-09-03 MED ORDER — SODIUM CHLORIDE 0.9 % IV SOLN
INTRAVENOUS | Status: AC
  Administered 2017-09-03: 14:00:00 via INTRAVENOUS

## 2017-09-03 MED ORDER — CANGRELOR BOLUS VIA INFUSION
INTRAVENOUS | Status: DC | PRN
  Administered 2017-09-03: 2490 ug via INTRAVENOUS

## 2017-09-03 MED ORDER — NITROGLYCERIN 1 MG/10 ML FOR IR/CATH LAB
INTRA_ARTERIAL | Status: DC | PRN
  Administered 2017-09-03 (×3): 200 ug via INTRA_ARTERIAL

## 2017-09-03 MED ORDER — TICAGRELOR 90 MG PO TABS
ORAL_TABLET | ORAL | Status: DC | PRN
Start: 2017-09-03 — End: 2017-09-03
  Administered 2017-09-03: 180 mg via ORAL

## 2017-09-03 MED ORDER — IOPAMIDOL (ISOVUE-370) INJECTION 76%
INTRAVENOUS | Status: AC
  Filled 2017-09-03: qty 100

## 2017-09-03 MED ORDER — NITROGLYCERIN 1 MG/10 ML FOR IR/CATH LAB
INTRA_ARTERIAL | Status: AC
  Filled 2017-09-03: qty 10

## 2017-09-03 MED ORDER — HEPARIN SODIUM (PORCINE) 1000 UNIT/ML IJ SOLN
INTRAMUSCULAR | Status: DC | PRN
  Administered 2017-09-03 (×2): 3000 [IU] via INTRAVENOUS
  Administered 2017-09-03: 9000 [IU] via INTRAVENOUS

## 2017-09-03 MED ORDER — FENTANYL CITRATE (PF) 100 MCG/2ML IJ SOLN
INTRAMUSCULAR | Status: AC
  Filled 2017-09-03: qty 2

## 2017-09-03 MED ORDER — ASPIRIN 81 MG PO CHEW: 81.0000 mg | CHEWABLE_TABLET | ORAL | Status: DC

## 2017-09-03 MED ORDER — LOSARTAN POTASSIUM 50 MG PO TABS
50.0000 mg | ORAL_TABLET | Freq: Every day | ORAL | Status: DC
  Administered 2017-09-04: 08:00:00 50 mg via ORAL
  Filled 2017-09-03: qty 1

## 2017-09-03 MED ORDER — LIDOCAINE HCL (PF) 1 % IJ SOLN
INTRAMUSCULAR | Status: AC
  Filled 2017-09-03: qty 30

## 2017-09-03 MED ORDER — ADENOSINE 12 MG/4ML IV SOLN
INTRAVENOUS | Status: AC
  Filled 2017-09-03: qty 16

## 2017-09-03 MED ORDER — IOPAMIDOL (ISOVUE-370) INJECTION 76%
INTRAVENOUS | Status: AC
  Filled 2017-09-03: qty 50

## 2017-09-03 MED ORDER — SODIUM CHLORIDE 0.9% FLUSH: 3.0000 mL | INTRAVENOUS | Status: DC | PRN

## 2017-09-03 MED ORDER — ROSUVASTATIN CALCIUM 20 MG PO TABS
20.0000 mg | ORAL_TABLET | Freq: Every day | ORAL | Status: DC
  Administered 2017-09-03: 21:00:00 20 mg via ORAL
  Filled 2017-09-03: qty 1

## 2017-09-03 MED ORDER — TICAGRELOR 90 MG PO TABS
ORAL_TABLET | ORAL | Status: AC
  Filled 2017-09-03: qty 2

## 2017-09-03 MED ORDER — HYDRALAZINE HCL 20 MG/ML IJ SOLN: 5.0000 mg | INTRAMUSCULAR | Status: AC | PRN

## 2017-09-03 MED ORDER — ASPIRIN 81 MG PO CHEW: 81.0000 mg | CHEWABLE_TABLET | Freq: Every day | ORAL | Status: DC

## 2017-09-03 MED ORDER — ANGIOPLASTY BOOK
Freq: Once | Status: AC
  Administered 2017-09-03: 1
  Filled 2017-09-03: qty 1

## 2017-09-03 MED ORDER — TICAGRELOR 90 MG PO TABS
90.0000 mg | ORAL_TABLET | Freq: Two times a day (BID) | ORAL | Status: DC
  Administered 2017-09-03 – 2017-09-04 (×2): 90 mg via ORAL
  Filled 2017-09-03 (×2): qty 1

## 2017-09-03 MED ORDER — LABETALOL HCL 5 MG/ML IV SOLN: 10.0000 mg | INTRAVENOUS | Status: AC | PRN

## 2017-09-03 MED ORDER — MIDAZOLAM HCL 2 MG/2ML IJ SOLN
INTRAMUSCULAR | Status: DC | PRN
  Administered 2017-09-03: 1 mg via INTRAVENOUS
  Administered 2017-09-03: 2 mg via INTRAVENOUS
  Administered 2017-09-03 (×2): 1 mg via INTRAVENOUS

## 2017-09-03 MED ORDER — ACETAMINOPHEN 325 MG PO TABS: 650.0000 mg | ORAL_TABLET | ORAL | Status: DC | PRN

## 2017-09-03 MED ORDER — ADENOSINE (DIAGNOSTIC) 140MCG/KG/MIN
INTRAVENOUS | Status: DC | PRN
  Administered 2017-09-03: 140 ug/kg/min via INTRAVENOUS

## 2017-09-03 MED ORDER — ALPRAZOLAM 0.25 MG PO TABS
0.2500 mg | ORAL_TABLET | Freq: Every day | ORAL | Status: DC | PRN
  Administered 2017-09-04: 08:00:00 0.25 mg via ORAL
  Filled 2017-09-03: qty 1

## 2017-09-03 MED ORDER — SODIUM CHLORIDE 0.9% FLUSH: 3.0000 mL | Freq: Two times a day (BID) | INTRAVENOUS | Status: DC

## 2017-09-03 MED ORDER — LIDOCAINE HCL (PF) 1 % IJ SOLN
INTRAMUSCULAR | Status: DC | PRN
  Administered 2017-09-03: 15 mL
  Administered 2017-09-03: 2 mL

## 2017-09-03 MED ORDER — SODIUM CHLORIDE 0.9 % IV SOLN
INTRAVENOUS | Status: AC | PRN
  Administered 2017-09-03: 4 ug/kg/min via INTRAVENOUS

## 2017-09-03 MED ORDER — IOPAMIDOL (ISOVUE-370) INJECTION 76%
INTRAVENOUS | Status: DC | PRN
  Administered 2017-09-03: 125 mL via INTRA_ARTERIAL

## 2017-09-03 MED ORDER — SODIUM CHLORIDE 0.9 % WEIGHT BASED INFUSION
3.0000 mL/kg/h | INTRAVENOUS | Status: DC
  Administered 2017-09-03: 3 mL/kg/h via INTRAVENOUS

## 2017-09-03 MED ORDER — ONDANSETRON HCL 4 MG/2ML IJ SOLN: 4.0000 mg | Freq: Four times a day (QID) | INTRAMUSCULAR | Status: DC | PRN

## 2017-09-03 MED ORDER — CANGRELOR TETRASODIUM 50 MG IV SOLR
INTRAVENOUS | Status: AC
  Filled 2017-09-03: qty 50

## 2017-09-03 MED ORDER — SODIUM CHLORIDE 0.9 % WEIGHT BASED INFUSION: 1.0000 mL/kg/h | INTRAVENOUS | Status: DC

## 2017-09-03 MED ORDER — ASPIRIN EC 81 MG PO TBEC
81.0000 mg | DELAYED_RELEASE_TABLET | Freq: Every day | ORAL | Status: DC
  Administered 2017-09-04: 08:00:00 81 mg via ORAL
  Filled 2017-09-03: qty 1

## 2017-09-03 MED ORDER — HEPARIN (PORCINE) IN NACL 2-0.9 UNIT/ML-% IJ SOLN
INTRAMUSCULAR | Status: AC
  Filled 2017-09-03: qty 1000

## 2017-09-03 MED ORDER — FENOFIBRATE 160 MG PO TABS
160.0000 mg | ORAL_TABLET | Freq: Every day | ORAL | Status: DC
  Administered 2017-09-04: 08:00:00 160 mg via ORAL
  Filled 2017-09-03: qty 1

## 2017-09-03 MED ORDER — FENTANYL CITRATE (PF) 100 MCG/2ML IJ SOLN
INTRAMUSCULAR | Status: DC | PRN
  Administered 2017-09-03 (×4): 25 ug via INTRAVENOUS

## 2017-09-03 SURGICAL SUPPLY — 25 items
BALLN SAPPHIRE 2.5X12 (BALLOONS) ×2
BALLN SAPPHIRE ~~LOC~~ 3.75X8 (BALLOONS) ×1 IMPLANT
BALLN WOLVERINE 3.50X10 (BALLOONS) ×2
BALLOON SAPPHIRE 2.5X12 (BALLOONS) IMPLANT
BALLOON WOLVERINE 3.50X10 (BALLOONS) IMPLANT
CATH 5FR JL3.5 JR4 ANG PIG MP (CATHETERS) ×1 IMPLANT
CATH INFINITI 5FR JL4 (CATHETERS) ×1 IMPLANT
CATH LAUNCHER 6FR EBU3.5 (CATHETERS) ×1 IMPLANT
COVER PRB 48X5XTLSCP FOLD TPE (BAG) IMPLANT
COVER PROBE 5X48 (BAG) ×2
GLIDESHEATH SLEND SS 6F .021 (SHEATH) ×1 IMPLANT
GUIDEWIRE INQWIRE 1.5J.035X260 (WIRE) IMPLANT
GUIDEWIRE PRESSURE COMET II (WIRE) ×1 IMPLANT
INQWIRE 1.5J .035X260CM (WIRE) ×2
KIT ENCORE 26 ADVANTAGE (KITS) ×1 IMPLANT
KIT HEART LEFT (KITS) ×2 IMPLANT
PACK CARDIAC CATHETERIZATION (CUSTOM PROCEDURE TRAY) ×2 IMPLANT
SHEATH PINNACLE 5F 10CM (SHEATH) ×1 IMPLANT
SHEATH PINNACLE 6F 10CM (SHEATH) ×1 IMPLANT
STENT SYNERGY DES 3X16 (Permanent Stent) ×1 IMPLANT
TRANSDUCER W/STOPCOCK (MISCELLANEOUS) ×2 IMPLANT
TUBING CIL FLEX 10 FLL-RA (TUBING) ×2 IMPLANT
VALVE GUARDIAN II ~~LOC~~ HEMO (MISCELLANEOUS) ×1 IMPLANT
WIRE EMERALD 3MM-J .035X150CM (WIRE) ×1 IMPLANT
WIRE HI TORQ VERSACORE-J 145CM (WIRE) ×1 IMPLANT

## 2017-09-03 NOTE — Care Management Note (Signed)
Case Management Note  Patient Details  Name: Jeff Prince MRN: [id number] Date of Birth: [birth year]  Subjective/Objective:  From home with spouse,he is very active, s/p coronary stent intervention, will be on brilinta,  NCM awaiting benefit check. NCM gave patient the 30 day coupon, he will be going to [place] to get brilinta, his son is pharmacist there and he states he knows how to run the coupon.                  Action/Plan: DC home today, has brilinat coupon.   Expected Discharge Date:                  Expected Discharge Plan:  Home/Self Care  In-House Referral:     Discharge planning Services  CM Consult  Post Acute Care Choice:    Choice offered to:     DME Arranged:    DME Agency:     HH Arranged:    [place] Agency:     Status of Service:  Completed, signed off  If discussed at [employer] of Stay Meetings, dates discussed:    Additional Comments:  Zenon Mayo, RN 09/03/2017, 3:22 PM

## 2017-09-03 NOTE — Progress Notes (Signed)
Patients 6 fr right femoral arterial sheath was pulled per order. Manual pressure was held for 20 minutes. Patient tolerated pull well. Sheath site during and post sheath pull was clean, dry, intact and had no sign of a hematoma. Patients VS remained WNL's during and post sheath pull. Patients bedrest began at 1150 and ends in 4 fours at 1550. Vascular site assessment was a level 0. Sheath removal site was dressed with a gauze dressing and was clean, dry, and intact. Patient was educated about post sheath pull management and stated he understood and had no questions.

## 2017-09-03 NOTE — Interval H&P Note (Signed)
History and Physical Interval Note:  09/03/2017 7:26 AM  Jeff Prince  has presented today for surgery, with the diagnosis of cp, cad  The various methods of treatment have been discussed with the patient and family. After consideration of risks, benefits and other options for treatment, the patient has consented to  Procedure(s): LEFT HEART CATH AND CORONARY ANGIOGRAPHY (N/A) as a surgical intervention .  The patient's history has been reviewed, patient examined, no change in status, stable for surgery.  I have reviewed the patient's chart and labs.  Questions were answered to the patient's satisfaction.     nonACS Class III angina No noninvasive testing No CABG  Larae Grooms

## 2017-09-04 ENCOUNTER — Encounter (HOSPITAL_COMMUNITY): Payer: Self-pay | Admitting: Cardiology

## 2017-09-04 DIAGNOSIS — I25118 Atherosclerotic heart disease of native coronary artery with other forms of angina pectoris: Secondary | ICD-10-CM | POA: Diagnosis not present

## 2017-09-04 DIAGNOSIS — Z8249 Family history of ischemic heart disease and other diseases of the circulatory system: Secondary | ICD-10-CM | POA: Diagnosis not present

## 2017-09-04 DIAGNOSIS — Z955 Presence of coronary angioplasty implant and graft: Secondary | ICD-10-CM | POA: Diagnosis not present

## 2017-09-04 DIAGNOSIS — I10 Essential (primary) hypertension: Secondary | ICD-10-CM | POA: Diagnosis not present

## 2017-09-04 DIAGNOSIS — T82855A Stenosis of coronary artery stent, initial encounter: Secondary | ICD-10-CM | POA: Diagnosis not present

## 2017-09-04 DIAGNOSIS — Z7982 Long term (current) use of aspirin: Secondary | ICD-10-CM | POA: Diagnosis not present

## 2017-09-04 DIAGNOSIS — E785 Hyperlipidemia, unspecified: Secondary | ICD-10-CM | POA: Diagnosis not present

## 2017-09-04 DIAGNOSIS — F419 Anxiety disorder, unspecified: Secondary | ICD-10-CM | POA: Diagnosis not present

## 2017-09-04 LAB — BASIC METABOLIC PANEL
Anion gap: 6 (ref 5–15)
BUN: 11 mg/dL (ref 6–20)
CALCIUM: 9 mg/dL (ref 8.9–10.3)
CHLORIDE: 106 mmol/L (ref 101–111)
CO2: 25 mmol/L (ref 22–32)
CREATININE: 1.15 mg/dL (ref 0.61–1.24)
GFR calc Af Amer: >60 mL/min (ref >60)
GFR calc non Af Amer: >60 mL/min (ref >60)
Glucose, Bld: 117 mg/dL — ABNORMAL HIGH (ref 65–99)
Potassium: 3.7 mmol/L (ref 3.5–5.1)
SODIUM: 137 mmol/L (ref 135–145)

## 2017-09-04 LAB — CBC
HCT: 38.9 % — ABNORMAL LOW (ref 39.0–52.0)
Hemoglobin: 13 g/dL (ref 13.0–17.0)
MCH: 30.2 pg (ref 26.0–34.0)
MCHC: 33.4 g/dL (ref 30.0–36.0)
MCV: 90.5 fL (ref 78.0–100.0)
PLATELETS: 184 10*3/uL (ref 150–400)
RBC: 4.3 MIL/uL (ref 4.22–5.81)
RDW: 12.5 % (ref 11.5–15.5)
WBC: 7 10*3/uL (ref 4.0–10.5)

## 2017-09-04 MED ORDER — TICAGRELOR 90 MG PO TABS: 90.0000 mg | ORAL_TABLET | Freq: Two times a day (BID) | ORAL | 10 refills | Status: DC

## 2017-09-04 MED ORDER — METOPROLOL TARTRATE 25 MG PO TABS: 25.0000 mg | ORAL_TABLET | Freq: Two times a day (BID) | ORAL | 1 refills | Status: DC

## 2017-09-04 MED ORDER — METOPROLOL TARTRATE 25 MG PO TABS
25.0000 mg | ORAL_TABLET | Freq: Two times a day (BID) | ORAL | Status: DC
  Administered 2017-09-04: 25 mg via ORAL
  Filled 2017-09-04: qty 1

## 2017-09-04 NOTE — Progress Notes (Signed)
CARDIAC REHAB PHASE I   PRE:  Rate/Rhythm: 73 SR PVCs  BP:  Supine: 170/105, 179/107  Sitting:   Standing:    SaO2:   MODE:  Ambulation: 1000 ft   POST:  Rate/Rhythm: 92 SR  BP:  Supine: 200/107  After walk.   164/85 with  rest  Sitting:   Standing:    SaO2: 100%RA 0805-0855 Pt walked 1000 ft with steady gait. No CP. Tolerated well except for elevated BP. Cardiology aware. Reviewed NTG use, ex ed, importance of brilinta with stent, getting back to his exercise and gave heart healthy diet. Pt voiced understanding. Discussed CRP 2 and will refer to [place].    Graylon Good, RN BSN  09/04/2017 8:52 AM

## 2017-09-04 NOTE — Discharge Summary (Signed)
Discharge Summary    Patient ID: LANDO ALCALDE,  MRN: [id number], DOB/AGE: 56-13-1962 56 y.o.  Admit date: 09/03/2017 Discharge date: 09/04/2017  Primary Care Provider: Sharilyn Sites Primary Cardiologist: Johnsie Cancel   Discharge Diagnoses    Active Problems:   Hyperlipidemia LDL goal <70   Coronary artery disease   Status post coronary artery stent placement   Allergies No Known Allergies  Diagnostic Studies/Procedures    Cath: 09/03/17  Conclusion     Prox LAD-1 lesion is 70% stenosed. FFR of LAD 0.77.  Previously placed Prox LAD-2 stent has an area of proximal stent restenosis, 70%, treated with a 3.5 x 10 Wolverine.  Post intervention, there is a 10% residual stenosis.  Mid LAD lesion is 75% stenosed. This was treated with a 3.0 x 16 Synergy drug-eluting stent, overlapping the distal edge of the previously placed stent from 2011.  A stent was successfully placed.  Post intervention, there is a 0% residual stenosis.  The left ventricular systolic function is normal.  LV end diastolic pressure is normal.  The left ventricular ejection fraction is 55-65% by visual estimate.  There is no aortic valve stenosis.  Unable to navigate right radial due to severe vasospasm.   Continue dual antiplatelet therapy for 12 months.  Brilinta was used since IV Cangrelor was used during the procedure.  If there is an issue with Brilinta such as cost, could switch to clopidogrel after 30 days.   _____________   History of Present Illness     WAGNER TANZI is a 56 y.o. male who presented to the office for chest pain.  He had a DES to the prox LAD on10/11. He was readmitted with SSCP about a week after implantation and F/U cath showed a patent stent. He has a significatn panic/anxiety disorder that needs further Rx.  Followed by Dr Orson Ape. He denied SSCP since 2nd D/C. He has been compliant with his meds   RTT 06/22/14 without ischemia .  Low risk test. Last saw Dr.  Johnsie Cancel 2016.   He had been doing great without any problems, PCP follows cholesterol which is controlled but TG elevated 05/28/17 at 1081.  LDL was 67 HDL 32 and A1C was 5.9.  He is on crestor 20, lovaza, 2 gm BID, and fenofibrate 160 mg.    In the office he presented with somewhat atypical Lt lateral chest pain. Occurred with exertion though he believed it may be upper arm movement.  It had wakened him from sleep - he believed due to position. No associated nausea or vomiting, no diaphoresis and no SOB.     Occurs once a week or so.  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  + strong family hx of CAD on his father's side. Given his symptoms and history he was referred for outpatient cath.     Hospital Course     Underwent cardiac cath with Dr. Irish Lack noted above with ISR in pLAD stent treated with [facility], and new mLAD lesion with PCI/DES x1. EF normal by LV gram. Plan for DAPT with ASA/Brilinta for at least one year. Post cath labs were stable with Cr 1.15 and Hgb 13.0. On good medical therapy prior to admission. Worked well with cardiac rehab. Added metoprolol 25mg  BID this admission.   General: Well developed, well nourished, male appearing in no acute distress. Head: Normocephalic,  atraumatic.  Neck: Supple without bruits, JVD. Lungs:  Resp regular and unlabored, CTA. Heart: RRR, S1, S2, no S3, S4, or murmur; no rub. Abdomen: Soft, non-tender, non-distended with normoactive bowel sounds. No hepatomegaly. No rebound/guarding. No obvious abdominal masses. Extremities: No clubbing, cyanosis, edema. Distal pedal pulses are 2+ bilaterally. R femoral cath site stable with mild bruising.  Neuro: Alert and oriented X 3. Moves all extremities spontaneously. Psych: Normal affect.  DAILEN MCCLISH was seen by Dr. Claiborne Billings and determined stable for discharge home. Follow up in the office has been  arranged. Medications are listed below.   _____________  Discharge Vitals Blood pressure (!) 135/98, pulse 77, temperature 98.1 F (36.7 C), temperature source Oral, resp. rate 18, height 6' (1.829 m), weight 178 lb 12.7 oz (81.1 kg), SpO2 99 %.  Filed Weights   09/03/17 0546 09/04/17 0600  Weight: 183 lb (83 kg) 178 lb 12.7 oz (81.1 kg)    Labs & Radiologic Studies    CBC Recent Labs    09/04/17 0213  WBC 7.0  HGB 13.0  HCT 38.9*  MCV 90.5  PLT 161   Basic Metabolic Panel Recent Labs    09/04/17 0213  NA 137  K 3.7  CL 106  CO2 25  GLUCOSE 117*  BUN 11  CREATININE 1.15  CALCIUM 9.0   Liver Function Tests No results for input(s): AST, ALT, ALKPHOS, BILITOT, PROT, ALBUMIN in the last 72 hours. No results for input(s): LIPASE, AMYLASE in the last 72 hours. Cardiac Enzymes No results for input(s): CKTOTAL, CKMB, CKMBINDEX, TROPONINI in the last 72 hours. BNP Invalid input(s): POCBNP D-Dimer No results for input(s): DDIMER in the last 72 hours. Hemoglobin A1C No results for input(s): HGBA1C in the last 72 hours. Fasting Lipid Panel No results for input(s): CHOL, HDL, LDLCALC, TRIG, CHOLHDL, LDLDIRECT in the last 72 hours. Thyroid Function Tests No results for input(s): TSH, T4TOTAL, T3FREE, THYROIDAB in the last 72 hours.  Invalid input(s): FREET3 _____________  No results found. Disposition   Pt is being discharged home today in good condition.  Follow-up Plans & Appointments    Follow-up Information    Burtis Junes, NP Follow up on 09/25/2017.   Specialties:  Nurse Practitioner, Interventional Cardiology, Cardiology, Radiology Why:  at 8:30am for your follow up appt.  Contact information: [address] [place] [address] [phone]          Discharge Instructions    Amb Referral to Cardiac Rehabilitation   Complete by:  As directed    Diagnosis:  Coronary Stents   Call MD for:  redness, tenderness, or signs of  infection (pain, swelling, redness, odor or green/yellow discharge around incision site)   Complete by:  As directed    Diet - low sodium heart healthy   Complete by:  As directed    Discharge instructions   Complete by:  As directed    Groin Site Care Refer to this sheet in the next few weeks. These instructions provide you with information on caring for yourself after your procedure. Your caregiver may also give you more specific instructions. Your treatment has been planned according to current medical practices, but problems sometimes occur. Call your caregiver if you have any problems or questions after your procedure. HOME CARE INSTRUCTIONS You may shower 24 hours after the procedure. Remove the bandage (dressing) and gently wash the site with plain soap and water. Gently pat the site dry.  Do not apply powder  or lotion to the site.  Do not sit in a bathtub, swimming pool, or whirlpool for 5 to 7 days.  No bending, squatting, or lifting anything over 10 pounds (4.5 kg) as directed by your caregiver.  Inspect the site at least twice daily.  Do not drive home if you are discharged the same day of the procedure. Have someone else drive you.  You may drive 24 hours after the procedure unless otherwise instructed by your caregiver.  What to expect: Any bruising will usually fade within 1 to 2 weeks.  Blood that collects in the tissue (hematoma) may be painful to the touch. It should usually decrease in size and tenderness within 1 to 2 weeks.  SEEK IMMEDIATE MEDICAL CARE IF: You have unusual pain at the groin site or down the affected leg.  You have redness, warmth, swelling, or pain at the groin site.  You have drainage (other than a small amount of blood on the dressing).  You have chills.  You have a fever or persistent symptoms for more than 72 hours.  You have a fever and your symptoms suddenly get worse.  Your leg becomes pale, cool, tingly, or numb.  You have heavy bleeding from  the site. Hold pressure on the site. Marland Kitchen  PLEASE DO NOT MISS ANY DOSES OF YOUR BRILINTA!!!!! Also keep a log of you blood pressures and bring back to your follow up appt. Please call the office with any questions.   Patients taking blood thinners should generally stay away from medicines like ibuprofen, Advil, Motrin, naproxen, and Aleve due to risk of stomach bleeding. You may take Tylenol as directed or talk to your primary doctor about alternatives.   Increase activity slowly   Complete by:  As directed       Discharge Medications     Medication List    TAKE these medications   ALPRAZolam 0.5 MG tablet Commonly known as:  XANAX Take 0.25-0.5 mg daily as needed by mouth for anxiety or sleep.   aspirin 81 MG tablet Take 81 mg daily by mouth.   CoQ10 200 MG Caps Take 200 mg daily by mouth.   fenofibrate 160 MG tablet Take 1 tablet (160 mg total) by mouth daily.   losartan 50 MG tablet Commonly known as:  COZAAR Take 1 tablet (50 mg total) by mouth daily.   metoprolol tartrate 25 MG tablet Commonly known as:  LOPRESSOR Take 1 tablet (25 mg total) 2 (two) times daily by mouth.   multivitamin tablet Take 1 tablet by mouth daily.   omega-3 acid ethyl esters 1 g capsule Commonly known as:  LOVAZA Take 2 g 2 (two) times daily by mouth.   rosuvastatin 20 MG tablet Commonly known as:  CRESTOR Take 1 tablet (20 mg total) by mouth daily. What changed:  when to take this   ticagrelor 90 MG Tabs tablet Commonly known as:  BRILINTA Take 1 tablet (90 mg total) 2 (two) times daily by mouth.   vitamin C 500 MG tablet Commonly known as:  ASCORBIC ACID Take 500 mg by mouth daily.   Vitamin D 2000 units Caps Take 2,000 Units daily by mouth.   vitamin E 400 UNIT capsule Take 400 Units daily by mouth. What changed:  Another medication with the same name was removed. Continue taking this medication, and follow the directions you see here.        Aspirin prescribed at  discharge?  Yes High Intensity Statin Prescribed? (Lipitor 40-80mg   or Crestor 20-40mg ): Yes Beta Blocker Prescribed? Yes For EF <40%, was ACEI/ARB Prescribed? Yes ADP Receptor Inhibitor Prescribed? (i.e. Plavix etc.-Includes Medically Managed Patients): Yes For EF <40%, Aldosterone Inhibitor Prescribed? No: EF ok Was EF assessed during THIS hospitalization? Yes Was Cardiac Rehab II ordered? (Included Medically managed Patients): Yes   Outstanding Labs/Studies   N/a  Duration of Discharge Encounter   Greater than 30 minutes including physician time.  Signed, Reino Bellis NP-C 09/04/2017, 9:57 AM   Patient seen and examined. Agree with assessment and plan. No recurrent chest pain. Angios  Reviewed with Dr. Irish Lack; s/p cutting balloon to prox LAD stent site and new stent to distal aspect of prior stent.  Feels well. R Radial cath site stable. Will add beta blocker to meds with resting pulse in the 80s and BP increased. Ambulated well today. DC today; f/u with Dr. Johnsie Cancel.   Troy Sine, MD, [facility] 09/04/2017 9:57 AM

## 2017-09-04 NOTE — Progress Notes (Signed)
#   8 S/W  RICKY  @ [employer] # [id number]   BRILINTA 90 MG BID   COVER- YES  CO-PAY- $ 50.00  PRIOR APPROVAL- NO   DEDUCTIBLE: O   PREFERRED PHARMACY : [facility], [facility], [facility] AND [facility]

## 2017-09-24 NOTE — Progress Notes (Signed)
CARDIOLOGY OFFICE NOTE  Date:  09/25/2017    Jeff Prince Date of Birth: [birth year] Medical Record #[id number]  PCP:  Sharilyn Sites, MD  Cardiologist:  Johnsie Cancel    Chief Complaint  Patient presents with  . Coronary Artery Disease    Post cath visit - seen for Dr. Johnsie Cancel    History of Present Illness: Jeff Prince is a 56 y.o. male who presents today for a post cath/PCI visit. Seen for Dr. Johnsie Cancel.   He has a history of known CAD. He has had prior DES to the prox LAD in 2011.  He was readmitted with SSCP about a week after implantation and F/U cath showed a patent stent. Other issues include significant anxiety/panic disorder, HLD and HTN. Strong FH for CAD.   Last seen by Dr. Johnsie Cancel in 2016.   Seen earlier this month by Cecilie Kicks, NP for chest pain - referred for repeat cath - see below. Noted to have ISR in pLAD stent treated with Wolverine, and new mLAD lesion with PCI/DES x1. EF normal by LV gram. Plan for DAPT with ASA/Brilinta for at least one year. Metoprolol 25mg  BID was added to his regimen.     Comes in today. Here alone. Doing well. Does note some shortness of breath - attributes this to his Brilinta. Says it is not anything that he cannot tolerate and is not debilitating. No chest pain. Has not returned to exercise and is anxious to do so. Lipids checked by PCP. BP is ok at home and here today. Feels ok otherwise and has no real concerns today. Notes right groin is stable.   Past Medical History:  Diagnosis Date  . Anxiety   . CAD (coronary artery disease)    11/18 cutting balloon to pLAD ISR, with DESx1 to mLAD, normal EF  . HLD (hyperlipidemia)   . HTN (hypertension)   . Pneumonia 1997   S/P gallbladder OR    Past Surgical History:  Procedure Laterality Date  . BILE DUCT STENT PLACEMENT  1997  . CORONARY ANGIOPLASTY WITH STENT PLACEMENT  2010; 09/03/2017   [place];   . CORONARY STENT INTERVENTION N/A 09/03/2017   Procedure: CORONARY STENT INTERVENTION;   Surgeon: Jettie Booze, MD;  Location: [facility];  Service: Cardiovascular;  Laterality: N/A;  . FLEXIBLE SIGMOIDOSCOPY     at age of 56 in [place]  . [place]   with stent, RMR  . LEFT HEART CATH AND CORONARY ANGIOGRAPHY N/A 09/03/2017   Procedure: LEFT HEART CATH AND CORONARY ANGIOGRAPHY;  Surgeon: Jettie Booze, MD;  Location: [facility];  Service: Cardiovascular;  Laterality: N/A;     Medications: Current Meds  Medication Sig  . ALPRAZolam (XANAX) 0.5 MG tablet Take 0.25-0.5 mg daily as needed by mouth for anxiety or sleep.   . Ascorbic Acid (VITAMIN C) 500 MG tablet Take 500 mg by mouth daily.    Marland Kitchen aspirin 81 MG tablet Take 81 mg daily by mouth.   . Cholecalciferol (VITAMIN D) 2000 UNITS CAPS Take 2,000 Units daily by mouth.   . Coenzyme Q10 (COQ10) 200 MG CAPS Take 200 mg daily by mouth.  . fenofibrate 160 MG tablet Take 1 tablet (160 mg total) by mouth daily.  Marland Kitchen losartan (COZAAR) 50 MG tablet Take 1 tablet (50 mg total) by mouth daily.  . metoprolol tartrate (LOPRESSOR) 25 MG tablet Take 1 tablet (25 mg total) by mouth 2 (two) times daily.  Marland Kitchen  Multiple Vitamin (MULTIVITAMIN) tablet Take 1 tablet by mouth daily.    Marland Kitchen omega-3 acid ethyl esters (LOVAZA) 1 g capsule Take 2 g 2 (two) times daily by mouth.  . rosuvastatin (CRESTOR) 20 MG tablet Take 20 mg by mouth daily.  . ticagrelor (BRILINTA) 90 MG TABS tablet Take 1 tablet (90 mg total) 2 (two) times daily by mouth.  . vitamin E 400 UNIT capsule Take 400 Units daily by mouth.  . [DISCONTINUED] metoprolol tartrate (LOPRESSOR) 25 MG tablet Take 1 tablet (25 mg total) 2 (two) times daily by mouth.  . [DISCONTINUED] rosuvastatin (CRESTOR) 20 MG tablet Take 1 tablet (20 mg total) by mouth daily. (Patient taking differently: Take 20 mg at bedtime by mouth. )     Allergies: No Known Allergies  Social History: The patient  reports that he has quit smoking. His smoking use  included cigarettes. He has a 13.50 pack-year smoking history. he has never used smokeless tobacco. He reports that he drinks about 12.6 oz of alcohol per week. He reports that he does not use drugs.   Family History: The patient's family history includes Cancer in his father; Diabetes in his mother; Heart attack in his paternal grandmother and paternal uncle; Hyperlipidemia in his brother, brother, and sister.   Review of Systems: Please see the history of present illness.   Otherwise, the review of systems is positive for none.   All other systems are reviewed and negative.   Physical Exam: VS:  BP 126/82   Pulse 69   Ht 6' (1.829 m)   Wt 187 lb (84.8 kg)   BMI 25.36 kg/m  .  BMI Body mass index is 25.36 kg/m.  Wt Readings from Last 3 Encounters:  09/25/17 187 lb (84.8 kg)  09/04/17 178 lb 12.7 oz (81.1 kg)  08/30/17 184 lb (83.5 kg)    General: Pleasant. Well developed, well nourished and in no acute distress.   HEENT: Normal.  Neck: Supple, no JVD, carotid bruits, or masses noted.  Cardiac: Regular rate and rhythm. No murmurs, rubs, or gallops. No edema.  Respiratory:  Lungs are clear to auscultation bilaterally with normal work of breathing.  GI: Soft and nontender.  MS: No deformity or atrophy. Gait and ROM intact.  Skin: Warm and dry. Color is normal.  Neuro:  Strength and sensation are intact and no gross focal deficits noted.  Psych: Alert, appropriate and with normal affect.   LABORATORY DATA:  EKG:  EKG is ordered today. This shows NSR with PVC.   Lab Results  Component Value Date   WBC 7.0 09/04/2017   HGB 13.0 09/04/2017   HCT 38.9 (L) 09/04/2017   PLT 184 09/04/2017   GLUCOSE 117 (H) 09/04/2017   NA 137 09/04/2017   K 3.7 09/04/2017   CL 106 09/04/2017   CREATININE 1.15 09/04/2017   BUN 11 09/04/2017   CO2 25 09/04/2017   TSH 1.481 Test methodology is 3rd generation TSH 03/01/2010   INR 1.0 08/30/2017     BNP (last 3 results) No results for  input(s): BNP in the last 8760 hours.  ProBNP (last 3 results) No results for input(s): PROBNP in the last 8760 hours.   Other Studies Reviewed Today:  CORONARY STENT INTERVENTION 08/2017  LEFT HEART CATH AND CORONARY ANGIOGRAPHY  Conclusion     Prox LAD-1 lesion is 70% stenosed. FFR of LAD 0.77.  Previously placed Prox LAD-2 stent has an area of proximal stent restenosis, 70%, treated with a 3.5  x 10 Wolverine.  Post intervention, there is a 10% residual stenosis.  Mid LAD lesion is 75% stenosed. This was treated with a 3.0 x 16 Synergy drug-eluting stent, overlapping the distal edge of the previously placed stent from 2011.  A stent was successfully placed.  Post intervention, there is a 0% residual stenosis.  The left ventricular systolic function is normal.  LV end diastolic pressure is normal.  The left ventricular ejection fraction is 55-65% by visual estimate.  There is no aortic valve stenosis.  Unable to navigate right radial due to severe vasospasm.   Continue dual antiplatelet therapy for 12 months.  Brilinta was used since IV Cangrelor was used during the procedure.  If there is an issue with Brilinta such as cost, could switch to clopidogrel after 30 days.  Continue aggressive secondary prevention.     Assessment/Plan:  1.  CAD - recent bout of chest pain with elective cath and subsequent PCI - on DAPT for next 12 months. Some shortness of breath which I suspect is due to the Brilinta - he Jeff try some caffeine and says this is not interfering. Would monitor for now. Lab today. Ok to resume his exercise program. Overall, he is felt to be doing well.   2. HTN - has had metoprolol recently added to his regimen - some degree of reported white coat syndrome - BP ok here today - no further changes made - I did send in a refill for him today.   3. HLD - on statin - monitored by his PCP  4. Anxiety/panic disorder - not addressed.   Current medicines are  reviewed with the patient today.  The patient does not have concerns regarding medicines other than what has been noted above.  The following changes have been made:  See above.  Labs/ tests ordered today include:    Orders Placed This Encounter  Procedures  . Basic metabolic panel  . CBC  . EKG 12-Lead     Disposition:   FU with Dr. Johnsie Cancel in 3 months.   Patient is agreeable to this plan and Jeff call if any problems develop in the interim.   SignedTruitt Merle, NP  09/25/2017 8:57 AM  [facility] [address], [place]  [address] Phone: [phone] Fax: [phone]

## 2017-09-25 ENCOUNTER — Encounter: Payer: Self-pay | Admitting: Nurse Practitioner

## 2017-09-25 ENCOUNTER — Ambulatory Visit: Payer: BLUE CROSS/BLUE SHIELD | Admitting: Nurse Practitioner

## 2017-09-25 VITALS — BP 126/82 | HR 69 | Ht 72.0 in | Wt 187.0 lb

## 2017-09-25 DIAGNOSIS — Z955 Presence of coronary angioplasty implant and graft: Secondary | ICD-10-CM

## 2017-09-25 DIAGNOSIS — I10 Essential (primary) hypertension: Secondary | ICD-10-CM

## 2017-09-25 DIAGNOSIS — E7849 Other hyperlipidemia: Secondary | ICD-10-CM | POA: Diagnosis not present

## 2017-09-25 DIAGNOSIS — I259 Chronic ischemic heart disease, unspecified: Secondary | ICD-10-CM | POA: Diagnosis not present

## 2017-09-25 LAB — BASIC METABOLIC PANEL
BUN/Creatinine Ratio: 12 (ref 9–20)
BUN: 15 mg/dL (ref 6–24)
CO2: 23 mmol/L (ref 20–29)
Calcium: 9.7 mg/dL (ref 8.7–10.2)
Chloride: 103 mmol/L (ref 96–106)
Creatinine, Ser: 1.22 mg/dL (ref 0.76–1.27)
GFR calc Af Amer: 76 mL/min/{1.73_m2} (ref >59)
GFR calc non Af Amer: 66 mL/min/{1.73_m2} (ref >59)
Glucose: 106 mg/dL — ABNORMAL HIGH (ref 65–99)
Potassium: 4.4 mmol/L (ref 3.5–5.2)
Sodium: 141 mmol/L (ref 134–144)

## 2017-09-25 LAB — CBC
Hematocrit: 43.2 % (ref 37.5–51.0)
Hemoglobin: 14.5 g/dL (ref 13.0–17.7)
MCH: 30.4 pg (ref 26.6–33.0)
MCHC: 33.6 g/dL (ref 31.5–35.7)
MCV: 91 fL (ref 79–97)
Platelets: 241 10*3/uL (ref 150–379)
RBC: 4.77 x10E6/uL (ref 4.14–5.80)
RDW: 13.1 % (ref 12.3–15.4)
WBC: 6.3 10*3/uL (ref 3.4–10.8)

## 2017-09-25 MED ORDER — NITROGLYCERIN 0.4 MG SL SUBL: 0.4000 mg | SUBLINGUAL_TABLET | SUBLINGUAL | 3 refills | Status: DC | PRN

## 2017-09-25 MED ORDER — METOPROLOL TARTRATE 25 MG PO TABS: 25.0000 mg | ORAL_TABLET | Freq: Two times a day (BID) | ORAL | 3 refills | Status: DC

## 2017-09-25 NOTE — Patient Instructions (Addendum)
We will be checking the following labs today - BMET and CBC   Medication Instructions:    Continue with your current medicines.   I did send in a refill for your Metoprolol today    Testing/Procedures To Be Arranged:  N/A  Follow-Up:   See Dr. Johnsie Cancel in about 3 months    Other Special Instructions:   Ok to resume your normal activities including exercise  Ok to take your Brilinta with caffeine to help with the shortness of breath - let us know if this worsens  Overall, I think you are doing very well    If you need a refill on your cardiac medications before your next appointment, please call your pharmacy.   Call the [place] office at [phone] if you have any questions, problems or concerns.

## 2017-12-17 NOTE — Progress Notes (Signed)
CARDIOLOGY OFFICE NOTE  Date:  12/26/2017    Jeff Prince Date of Birth: 1960-11-14 Medical Record #[id number]  PCP:  Sharilyn Sites, MD  Cardiologist:  Johnsie Cancel    No chief complaint on file.   History of Present Illness:  57 y.o. I have not seen since 2016 Last seen by PA 09/25/17 CAD with DES to proximal LAD in 2011 Repeat cath 09/03/17 ISR and new stent to mid LAD.  DAT until at least 08/2018. EF normal   CRF;s include HTN, HLD.  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 Anxiety   . CAD (coronary artery disease)    11/18 cutting balloon to pLAD ISR, with DESx1 to mLAD, normal EF  . HLD (hyperlipidemia)   . HTN (hypertension)   . Pneumonia 1997   S/P gallbladder OR    Past Surgical History:  Procedure Laterality Date  . BILE DUCT STENT PLACEMENT  1997  . CORONARY ANGIOPLASTY WITH STENT PLACEMENT  2010; 09/03/2017   [place];   . CORONARY STENT INTERVENTION N/A 09/03/2017   Procedure: CORONARY STENT INTERVENTION;  Surgeon: Jettie Booze, MD;  Location: [facility];  Service: Cardiovascular;  Laterality: N/A;  . FLEXIBLE SIGMOIDOSCOPY     at age of 24 in [place]  . [place]   with stent, RMR  . LEFT HEART CATH AND CORONARY ANGIOGRAPHY N/A 09/03/2017   Procedure: LEFT HEART CATH AND CORONARY ANGIOGRAPHY;  Surgeon: Jettie Booze, MD;  Location: [facility];  Service: Cardiovascular;  Laterality: N/A;     Medications: Current Meds  Medication Sig  . ALPRAZolam (XANAX) 0.5 MG tablet Take 0.25-0.5 mg daily as needed by mouth for anxiety or sleep.   . Ascorbic Acid (VITAMIN C) 500 MG tablet Take 500  mg by mouth daily.    Marland Kitchen aspirin 81 MG tablet Take 81 mg daily by mouth.   . Cholecalciferol (VITAMIN D) 2000 UNITS CAPS Take 2,000 Units daily by mouth.   . Coenzyme Q10 (COQ10) 200 MG CAPS Take 200 mg daily by mouth.  . fenofibrate 160 MG tablet Take 1 tablet (160 mg total) by mouth daily.  Marland Kitchen losartan (COZAAR) 50 MG tablet Take 1 tablet (50 mg total) by mouth daily.  . metoprolol tartrate (LOPRESSOR) 25 MG tablet Take 1 tablet (25 mg total) by mouth 2 (two) times daily.  . Multiple Vitamin (MULTIVITAMIN) tablet Take 1 tablet by mouth daily.    . nitroGLYCERIN (NITROSTAT) 0.4 MG SL tablet Place 1 tablet (0.4 mg total) under the tongue every 5 (five) minutes as needed for chest pain.  Marland Kitchen omega-3 acid ethyl esters (LOVAZA) 1 g capsule Take 2 g 2 (two) times daily by mouth.  . rosuvastatin (CRESTOR) 20 MG tablet Take 20 mg by mouth daily.  . ticagrelor (BRILINTA) 90 MG TABS tablet Take 1 tablet (90 mg total) by mouth 2 (two) times daily.  . vitamin E 400 UNIT capsule Take 400 Units daily by mouth.  . [DISCONTINUED] ticagrelor (BRILINTA) 90 MG TABS tablet Take 1 tablet (90 mg total) 2 (two) times daily by mouth.     Allergies:  No Known Allergies  Social History: The patient  reports that he has quit smoking. His smoking use included cigarettes. He has a 13.50 pack-year smoking history. he has never used smokeless tobacco. He reports that he drinks about 12.6 oz of alcohol per week. He reports that he does not use drugs.   Family History: The patient's family history includes Cancer in his father; Diabetes in his mother; Heart attack in his paternal grandmother and paternal uncle; Hyperlipidemia in his brother, brother, and sister.   Review of Systems: Please see the history of present illness.   Otherwise, the review of systems is positive for none.   All other systems are reviewed and negative.   Physical Exam: VS:  BP 132/82   Pulse 64   Ht 6' (1.829 m)   Wt 189 lb (85.7 kg)   BMI  25.63 kg/m  .  BMI Body mass index is 25.63 kg/m.  Wt Readings from Last 3 Encounters:  12/26/17 189 lb (85.7 kg)  09/25/17 187 lb (84.8 kg)  09/04/17 178 lb 12.7 oz (81.1 kg)    Affect appropriate Healthy:  appears stated age HEENT: normal Neck supple with no adenopathy JVP normal no bruits no thyromegaly Lungs clear with no wheezing and good diaphragmatic motion Heart:  S1/S2 no murmur, no rub, gallop or click PMI normal Abdomen: benighn, BS positve, no tenderness, no AAA no bruit.  No HSM or HJR Distal pulses intact with no bruits No edema Neuro non-focal Skin warm and dry No muscular weakness   Current Outpatient Medications:  .  ALPRAZolam (XANAX) 0.5 MG tablet, Take 0.25-0.5 mg daily as needed by mouth for anxiety or sleep. , Disp: , Rfl:  .  Ascorbic Acid (VITAMIN C) 500 MG tablet, Take 500 mg by mouth daily.  , Disp: , Rfl:  .  aspirin 81 MG tablet, Take 81 mg daily by mouth. , Disp: , Rfl:  .  Cholecalciferol (VITAMIN D) 2000 UNITS CAPS, Take 2,000 Units daily by mouth. , Disp: , Rfl:  .  Coenzyme Q10 (COQ10) 200 MG CAPS, Take 200 mg daily by mouth., Disp: , Rfl:  .  fenofibrate 160 MG tablet, Take 1 tablet (160 mg total) by mouth daily., Disp: 90 tablet, Rfl: 3 .  losartan (COZAAR) 50 MG tablet, Take 1 tablet (50 mg total) by mouth daily., Disp: 90 tablet, Rfl: 3 .  metoprolol tartrate (LOPRESSOR) 25 MG tablet, Take 1 tablet (25 mg total) by mouth 2 (two) times daily., Disp: 180 tablet, Rfl: 3 .  Multiple Vitamin (MULTIVITAMIN) tablet, Take 1 tablet by mouth daily.  , Disp: , Rfl:  .  nitroGLYCERIN (NITROSTAT) 0.4 MG SL tablet, Place 1 tablet (0.4 mg total) under the tongue every 5 (five) minutes as needed for chest pain., Disp: 25 tablet, Rfl: 3 .  omega-3 acid ethyl esters (LOVAZA) 1 g capsule, Take 2 g 2 (two) times daily by mouth., Disp: , Rfl:  .  rosuvastatin (CRESTOR) 20 MG tablet, Take 20 mg by mouth daily., Disp: , Rfl:  .  ticagrelor (BRILINTA) 90 MG TABS  tablet, Take 1 tablet (90 mg total) by mouth 2 (two) times daily., Disp: 180 tablet, Rfl: 3 .  vitamin E 400 UNIT capsule, Take 400 Units daily by mouth., Disp: , Rfl:   LABORATORY DATA:  EKG:  09/25/17   This shows NSR with PVC.normal T waves    Lab Results  Component Value Date   WBC 7.0 09/04/2017   HGB 13.0 09/04/2017  HCT 38.9 (L) 09/04/2017   PLT 184 09/04/2017   GLUCOSE 117 (H) 09/04/2017   NA 137 09/04/2017   K 3.7 09/04/2017   CL 106 09/04/2017   CREATININE 1.15 09/04/2017   BUN 11 09/04/2017   CO2 25 09/04/2017   TSH 1.481 Test methodology is 3rd generation TSH 03/01/2010   INR 1.0 08/30/2017     BNP (last 3 results) No results for input(s): BNP in the last 8760 hours.  ProBNP (last 3 results) No results for input(s): PROBNP in the last 8760 hours.   Other Studies Reviewed Today:  CORONARY STENT INTERVENTION 08/2017  LEFT HEART CATH AND CORONARY ANGIOGRAPHY  Conclusion     Prox LAD-1 lesion is 70% stenosed. FFR of LAD 0.77.  Previously placed Prox LAD-2 stent has an area of proximal stent restenosis, 70%, treated with a 3.5 x 10 Wolverine.  Post intervention, there is a 10% residual stenosis.  Mid LAD lesion is 75% stenosed. This was treated with a 3.0 x 16 Synergy drug-eluting stent, overlapping the distal edge of the previously placed stent from 2011.  A stent was successfully placed.  Post intervention, there is a 0% residual stenosis.  The left ventricular systolic function is normal.  LV end diastolic pressure is normal.  The left ventricular ejection fraction is 55-65% by visual estimate.  There is no aortic valve stenosis.  Unable to navigate right radial due to severe vasospasm.   Continue dual antiplatelet therapy for 12 months.  Brilinta was used since IV Cangrelor was used during the procedure.  If there is an issue with Brilinta such as cost, could switch to clopidogrel after 30 days.  Continue aggressive secondary  prevention.     Assessment/Plan:  1.  CAD - recent ISR proximal and mid LAD 08/2017 some dyspnea with Brilinta mild no angina continue medical Rx  2. HTN - Well controlled.  Continue current medications and low sodium Dash type diet.    3. HLD - on statin labs with primary   4. Anxiety/panic disorder - PRN xanax   Jenkins Rouge

## 2017-12-26 ENCOUNTER — Ambulatory Visit: Payer: BLUE CROSS/BLUE SHIELD | Admitting: Cardiovascular Disease

## 2017-12-26 ENCOUNTER — Encounter: Payer: Self-pay | Admitting: Cardiovascular Disease

## 2017-12-26 VITALS — BP 132/82 | HR 64 | Ht 72.0 in | Wt 189.0 lb

## 2017-12-26 DIAGNOSIS — E782 Mixed hyperlipidemia: Secondary | ICD-10-CM | POA: Diagnosis not present

## 2017-12-26 DIAGNOSIS — I2510 Atherosclerotic heart disease of native coronary artery without angina pectoris: Secondary | ICD-10-CM | POA: Diagnosis not present

## 2017-12-26 DIAGNOSIS — I10 Essential (primary) hypertension: Secondary | ICD-10-CM | POA: Diagnosis not present

## 2017-12-26 MED ORDER — TICAGRELOR 90 MG PO TABS: 90.0000 mg | ORAL_TABLET | Freq: Two times a day (BID) | ORAL | 3 refills | Status: DC

## 2017-12-26 NOTE — Patient Instructions (Addendum)
Medication Instructions:  Your physician recommends that you continue on your current medications as directed. Please refer to the Current Medication list given to you today.  Labwork: NONE  Testing/Procedures: NONE  Follow-Up: Your physician wants you to follow-up in: 8 months with Dr. Johnsie Cancel in [place]. You will receive a reminder letter in the mail two months in advance. If you don't receive a letter, please call our office to schedule the follow-up appointment.   If you need a refill on your cardiac medications before your next appointment, please call your pharmacy.

## 2018-01-07 DIAGNOSIS — R7301 Impaired fasting glucose: Secondary | ICD-10-CM | POA: Diagnosis not present

## 2018-01-07 DIAGNOSIS — Z008 Encounter for other general examination: Secondary | ICD-10-CM | POA: Diagnosis not present

## 2018-01-07 DIAGNOSIS — E782 Mixed hyperlipidemia: Secondary | ICD-10-CM | POA: Diagnosis not present

## 2018-01-07 DIAGNOSIS — Z719 Counseling, unspecified: Secondary | ICD-10-CM | POA: Diagnosis not present

## 2018-01-07 DIAGNOSIS — I10 Essential (primary) hypertension: Secondary | ICD-10-CM | POA: Diagnosis not present

## 2018-03-18 DIAGNOSIS — Z008 Encounter for other general examination: Secondary | ICD-10-CM | POA: Diagnosis not present

## 2018-03-18 DIAGNOSIS — I10 Essential (primary) hypertension: Secondary | ICD-10-CM | POA: Diagnosis not present

## 2018-03-18 DIAGNOSIS — Z719 Counseling, unspecified: Secondary | ICD-10-CM | POA: Diagnosis not present

## 2018-03-18 DIAGNOSIS — R7301 Impaired fasting glucose: Secondary | ICD-10-CM | POA: Diagnosis not present

## 2018-04-07 DIAGNOSIS — J069 Acute upper respiratory infection, unspecified: Secondary | ICD-10-CM | POA: Diagnosis not present

## 2018-04-25 DIAGNOSIS — Z6826 Body mass index (BMI) 26.0-26.9, adult: Secondary | ICD-10-CM | POA: Diagnosis not present

## 2018-04-25 DIAGNOSIS — Z23 Encounter for immunization: Secondary | ICD-10-CM | POA: Diagnosis not present

## 2018-04-25 DIAGNOSIS — E781 Pure hyperglyceridemia: Secondary | ICD-10-CM | POA: Diagnosis not present

## 2018-04-25 DIAGNOSIS — C61 Malignant neoplasm of prostate: Secondary | ICD-10-CM | POA: Diagnosis not present

## 2018-04-25 DIAGNOSIS — Z1389 Encounter for screening for other disorder: Secondary | ICD-10-CM | POA: Diagnosis not present

## 2018-04-25 DIAGNOSIS — I2510 Atherosclerotic heart disease of native coronary artery without angina pectoris: Secondary | ICD-10-CM | POA: Diagnosis not present

## 2018-04-25 DIAGNOSIS — I10 Essential (primary) hypertension: Secondary | ICD-10-CM | POA: Diagnosis not present

## 2018-04-25 DIAGNOSIS — Z125 Encounter for screening for malignant neoplasm of prostate: Secondary | ICD-10-CM | POA: Diagnosis not present

## 2018-04-29 DIAGNOSIS — G4730 Sleep apnea, unspecified: Secondary | ICD-10-CM | POA: Diagnosis not present

## 2018-05-03 DIAGNOSIS — G4730 Sleep apnea, unspecified: Secondary | ICD-10-CM | POA: Diagnosis not present

## 2018-05-21 DIAGNOSIS — G4733 Obstructive sleep apnea (adult) (pediatric): Secondary | ICD-10-CM | POA: Diagnosis not present

## 2018-06-02 DIAGNOSIS — G4733 Obstructive sleep apnea (adult) (pediatric): Secondary | ICD-10-CM | POA: Diagnosis not present

## 2018-07-03 DIAGNOSIS — G4733 Obstructive sleep apnea (adult) (pediatric): Secondary | ICD-10-CM | POA: Diagnosis not present

## 2018-07-08 DIAGNOSIS — Z7189 Other specified counseling: Secondary | ICD-10-CM | POA: Diagnosis not present

## 2018-07-08 DIAGNOSIS — Z79899 Other long term (current) drug therapy: Secondary | ICD-10-CM | POA: Diagnosis not present

## 2018-07-08 DIAGNOSIS — E782 Mixed hyperlipidemia: Secondary | ICD-10-CM | POA: Diagnosis not present

## 2018-07-08 DIAGNOSIS — Z139 Encounter for screening, unspecified: Secondary | ICD-10-CM | POA: Diagnosis not present

## 2018-07-08 DIAGNOSIS — R7301 Impaired fasting glucose: Secondary | ICD-10-CM | POA: Diagnosis not present

## 2018-07-08 DIAGNOSIS — I10 Essential (primary) hypertension: Secondary | ICD-10-CM | POA: Diagnosis not present

## 2018-07-08 DIAGNOSIS — Z008 Encounter for other general examination: Secondary | ICD-10-CM | POA: Diagnosis not present

## 2018-07-09 DIAGNOSIS — G4733 Obstructive sleep apnea (adult) (pediatric): Secondary | ICD-10-CM | POA: Diagnosis not present

## 2018-07-29 DIAGNOSIS — E782 Mixed hyperlipidemia: Secondary | ICD-10-CM | POA: Diagnosis not present

## 2018-07-29 DIAGNOSIS — B349 Viral infection, unspecified: Secondary | ICD-10-CM | POA: Diagnosis not present

## 2018-07-29 DIAGNOSIS — Z008 Encounter for other general examination: Secondary | ICD-10-CM | POA: Diagnosis not present

## 2018-07-29 DIAGNOSIS — I10 Essential (primary) hypertension: Secondary | ICD-10-CM | POA: Diagnosis not present

## 2018-07-29 DIAGNOSIS — R7301 Impaired fasting glucose: Secondary | ICD-10-CM | POA: Diagnosis not present

## 2018-08-02 DIAGNOSIS — G4733 Obstructive sleep apnea (adult) (pediatric): Secondary | ICD-10-CM | POA: Diagnosis not present

## 2018-08-05 DIAGNOSIS — E782 Mixed hyperlipidemia: Secondary | ICD-10-CM | POA: Diagnosis not present

## 2018-08-05 DIAGNOSIS — I2510 Atherosclerotic heart disease of native coronary artery without angina pectoris: Secondary | ICD-10-CM | POA: Diagnosis not present

## 2018-08-05 DIAGNOSIS — G4739 Other sleep apnea: Secondary | ICD-10-CM | POA: Diagnosis not present

## 2018-08-05 DIAGNOSIS — Z6826 Body mass index (BMI) 26.0-26.9, adult: Secondary | ICD-10-CM | POA: Diagnosis not present

## 2018-08-05 DIAGNOSIS — I10 Essential (primary) hypertension: Secondary | ICD-10-CM | POA: Diagnosis not present

## 2018-08-05 DIAGNOSIS — R7309 Other abnormal glucose: Secondary | ICD-10-CM | POA: Diagnosis not present

## 2018-08-05 DIAGNOSIS — Z1389 Encounter for screening for other disorder: Secondary | ICD-10-CM | POA: Diagnosis not present

## 2018-09-02 DIAGNOSIS — G4733 Obstructive sleep apnea (adult) (pediatric): Secondary | ICD-10-CM | POA: Diagnosis not present

## 2018-10-02 ENCOUNTER — Other Ambulatory Visit: Payer: Self-pay | Admitting: Nurse Practitioner

## 2018-10-02 DIAGNOSIS — G4733 Obstructive sleep apnea (adult) (pediatric): Secondary | ICD-10-CM | POA: Diagnosis not present

## 2018-10-17 DIAGNOSIS — E781 Pure hyperglyceridemia: Secondary | ICD-10-CM | POA: Diagnosis not present

## 2018-10-17 DIAGNOSIS — I2510 Atherosclerotic heart disease of native coronary artery without angina pectoris: Secondary | ICD-10-CM | POA: Diagnosis not present

## 2018-10-17 DIAGNOSIS — E663 Overweight: Secondary | ICD-10-CM | POA: Diagnosis not present

## 2018-10-17 DIAGNOSIS — I10 Essential (primary) hypertension: Secondary | ICD-10-CM | POA: Diagnosis not present

## 2018-10-17 DIAGNOSIS — Z1389 Encounter for screening for other disorder: Secondary | ICD-10-CM | POA: Diagnosis not present

## 2018-10-17 DIAGNOSIS — Z0001 Encounter for general adult medical examination with abnormal findings: Secondary | ICD-10-CM | POA: Diagnosis not present

## 2018-10-17 DIAGNOSIS — Z6826 Body mass index (BMI) 26.0-26.9, adult: Secondary | ICD-10-CM | POA: Diagnosis not present

## 2018-10-17 DIAGNOSIS — E785 Hyperlipidemia, unspecified: Secondary | ICD-10-CM | POA: Diagnosis not present

## 2018-10-17 DIAGNOSIS — R7309 Other abnormal glucose: Secondary | ICD-10-CM | POA: Diagnosis not present

## 2018-10-20 DIAGNOSIS — E782 Mixed hyperlipidemia: Secondary | ICD-10-CM | POA: Diagnosis not present

## 2018-10-20 DIAGNOSIS — Z1389 Encounter for screening for other disorder: Secondary | ICD-10-CM | POA: Diagnosis not present

## 2018-10-24 NOTE — Progress Notes (Signed)
CARDIOLOGY OFFICE NOTE  Date:  10/28/2018    Jeff Prince Date of Birth: April 06, 1961 Medical Record #[id number]  PCP:  Sharilyn Sites, MD  Cardiologist:  Johnsie Cancel    No chief complaint on file.   History of Present Illness:  57 y.o.  CAD with DES to proximal LAD in 2011 Repeat cath 09/03/17 ISR and new stent to mid LAD.  DAT until at least 08/2018. EF normal   CRF;s include HTN, HLD.  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 Anxiety   . CAD (coronary artery disease)    11/18 cutting balloon to pLAD ISR, with DESx1 to mLAD, normal EF  . HLD (hyperlipidemia)   . HTN (hypertension)   . Pneumonia 1997   S/P gallbladder OR    Past Surgical History:  Procedure Laterality Date  . BILE DUCT STENT PLACEMENT  1997  . CORONARY ANGIOPLASTY WITH STENT PLACEMENT  2010; 09/03/2017   [place];   . CORONARY STENT INTERVENTION N/A 09/03/2017   Procedure: CORONARY STENT INTERVENTION;  Surgeon: Jettie Booze, MD;  Location: [facility];  Service: Cardiovascular;  Laterality: N/A;  . FLEXIBLE SIGMOIDOSCOPY     at age of 70 in [place]  . [place]   with stent, RMR  . LEFT HEART CATH AND CORONARY ANGIOGRAPHY N/A 09/03/2017   Procedure: LEFT HEART CATH AND CORONARY ANGIOGRAPHY;  Surgeon: Jettie Booze, MD;  Location: [facility];  Service: Cardiovascular;  Laterality: N/A;     Medications: Current Meds  Medication Sig  . ALPRAZolam (XANAX) 0.5 MG tablet Take 0.25-0.5 mg daily as needed by mouth for anxiety or sleep.   Marland Kitchen  aspirin 81 MG tablet Take 81 mg daily by mouth.   . Cholecalciferol (VITAMIN D) 2000 UNITS CAPS Take 2,000 Units daily by mouth.   . Coenzyme Q10 (COQ10) 200 MG CAPS Take 200 mg daily by mouth.  . fenofibrate 160 MG tablet Take 1 tablet (160 mg total) by mouth daily.  Vanessa Kick Ethyl (VASCEPA PO) Take 1 g by mouth 4 (four) times daily.  Marland Kitchen losartan (COZAAR) 50 MG tablet Take 1 tablet (50 mg total) by mouth daily.  . metoprolol tartrate (LOPRESSOR) 25 MG tablet Take 1 tablet (25 mg total) by mouth 2 (two) times daily.  . Multiple Vitamin (MULTIVITAMIN) tablet Take 1 tablet by mouth daily.    Marland Kitchen omega-3 acid ethyl esters (LOVAZA) 1 g capsule Take 2 g 2 (two) times daily by mouth.  . rosuvastatin (CRESTOR) 20 MG tablet Take 20 mg by mouth daily.  . ticagrelor (BRILINTA) 90 MG TABS tablet Take 1 tablet (90 mg total) by mouth 2 (two) times daily.  . vitamin E 400 UNIT capsule Take 400 Units daily by mouth.  . [DISCONTINUED] metoprolol tartrate (LOPRESSOR) 25 MG tablet Take 1 tablet (25 mg total) by mouth 2 (two) times daily. Please make yearly appt with Dr.Abanoub Hanken for February for future refills.1st attempt  . [DISCONTINUED] ticagrelor (BRILINTA) 90 MG TABS tablet Take 1  tablet (90 mg total) by mouth 2 (two) times daily.     Allergies: No Known Allergies  Social History: The patient  reports that he has quit smoking. His smoking use included cigarettes. He has a 13.50 pack-year smoking history. He has never used smokeless tobacco. He reports current alcohol use of about 21.0 standard drinks of alcohol per week. He reports that he does not use drugs.   Family History: The patient's family history includes Cancer in his father; Diabetes in his mother; Heart attack in his paternal grandmother and paternal uncle; Hyperlipidemia in his brother, brother, and sister.   Review of Systems: Please see the history of present illness.   Otherwise, the review of systems is positive for none.   All other  systems are reviewed and negative.   Physical Exam: VS:  BP 124/70   Pulse 70   Ht 6' (1.829 m)   Wt 192 lb 6.4 oz (87.3 kg)   BMI 26.09 kg/m  .  BMI Body mass index is 26.09 kg/m.  Wt Readings from Last 3 Encounters:  10/28/18 192 lb 6.4 oz (87.3 kg)  12/26/17 189 lb (85.7 kg)  09/25/17 187 lb (84.8 kg)   Affect appropriate Healthy:  appears stated age 31: normal Neck supple with no adenopathy JVP normal no bruits no thyromegaly Lungs clear with no wheezing and good diaphragmatic motion Heart:  S1/S2 no murmur, no rub, gallop or click PMI normal Abdomen: benighn, BS positve, no tenderness, no AAA no bruit.  No HSM or HJR Distal pulses intact with no bruits No edema Neuro non-focal Skin warm and dry No muscular weakness    Current Outpatient Medications:  .  ALPRAZolam (XANAX) 0.5 MG tablet, Take 0.25-0.5 mg daily as needed by mouth for anxiety or sleep. , Disp: , Rfl:  .  aspirin 81 MG tablet, Take 81 mg daily by mouth. , Disp: , Rfl:  .  Cholecalciferol (VITAMIN D) 2000 UNITS CAPS, Take 2,000 Units daily by mouth. , Disp: , Rfl:  .  Coenzyme Q10 (COQ10) 200 MG CAPS, Take 200 mg daily by mouth., Disp: , Rfl:  .  fenofibrate 160 MG tablet, Take 1 tablet (160 mg total) by mouth daily., Disp: 90 tablet, Rfl: 3 .  Icosapent Ethyl (VASCEPA PO), Take 1 g by mouth 4 (four) times daily., Disp: , Rfl:  .  losartan (COZAAR) 50 MG tablet, Take 1 tablet (50 mg total) by mouth daily., Disp: 90 tablet, Rfl: 3 .  metoprolol tartrate (LOPRESSOR) 25 MG tablet, Take 1 tablet (25 mg total) by mouth 2 (two) times daily., Disp: 180 tablet, Rfl: 3 .  Multiple Vitamin (MULTIVITAMIN) tablet, Take 1 tablet by mouth daily.  , Disp: , Rfl:  .  omega-3 acid ethyl esters (LOVAZA) 1 g capsule, Take 2 g 2 (two) times daily by mouth., Disp: , Rfl:  .  rosuvastatin (CRESTOR) 20 MG tablet, Take 20 mg by mouth daily., Disp: , Rfl:  .  ticagrelor (BRILINTA) 90 MG TABS tablet, Take 1 tablet (90 mg  total) by mouth 2 (two) times daily., Disp: 180 tablet, Rfl: 3 .  vitamin E 400 UNIT capsule, Take 400 Units daily by mouth., Disp: , Rfl:  .  nitroGLYCERIN (NITROSTAT) 0.4 MG SL tablet, Place 1 tablet (0.4 mg total) under the tongue every 5 (five) minutes as needed for chest pain., Disp: 25 tablet, Rfl: 3  LABORATORY DATA:  EKG:  10/28/18  SR rate 70 PVC otherwise normal    Lab  Results  Component Value Date   WBC 7.0 09/04/2017   HGB 13.0 09/04/2017   HCT 38.9 (L) 09/04/2017   PLT 184 09/04/2017   GLUCOSE 117 (H) 09/04/2017   NA 137 09/04/2017   K 3.7 09/04/2017   CL 106 09/04/2017   CREATININE 1.15 09/04/2017   BUN 11 09/04/2017   CO2 25 09/04/2017   TSH 1.481 Test methodology is 3rd generation TSH 03/01/2010   INR 1.0 08/30/2017    Other Studies Reviewed Today:  CORONARY STENT INTERVENTION 08/2017  LEFT HEART CATH AND CORONARY ANGIOGRAPHY  Conclusion     Prox LAD-1 lesion is 70% stenosed. FFR of LAD 0.77.  Previously placed Prox LAD-2 stent has an area of proximal stent restenosis, 70%, treated with a 3.5 x 10 Wolverine.  Post intervention, there is a 10% residual stenosis.  Mid LAD lesion is 75% stenosed. This was treated with a 3.0 x 16 Synergy drug-eluting stent, overlapping the distal edge of the previously placed stent from 2011.  A stent was successfully placed.  Post intervention, there is a 0% residual stenosis.  The left ventricular systolic function is normal.  LV end diastolic pressure is normal.  The left ventricular ejection fraction is 55-65% by visual estimate.  There is no aortic valve stenosis.  Unable to navigate right radial due to severe vasospasm.   Continue dual antiplatelet therapy for 12 months.  Brilinta was used since IV Cangrelor was used during the procedure.  If there is an issue with Brilinta such as cost, could switch to clopidogrel after 30 days.  Continue aggressive secondary prevention.     Assessment/Plan:  1.   CAD - ISR proximal and mid LAD 08/2017 some dyspnea with Brilinta mild no angina continue medical Rx  2. HTN - Well controlled.  Continue current medications and low sodium Dash type diet.    3. HLD - on statin labs with primary   4. Anxiety/panic disorder - PRN xanax   Jenkins Rouge

## 2018-10-28 ENCOUNTER — Ambulatory Visit: Payer: BLUE CROSS/BLUE SHIELD | Admitting: Cardiovascular Disease

## 2018-10-28 ENCOUNTER — Encounter: Payer: Self-pay | Admitting: Cardiovascular Disease

## 2018-10-28 VITALS — BP 124/70 | HR 70 | Ht 72.0 in | Wt 192.4 lb

## 2018-10-28 DIAGNOSIS — I2510 Atherosclerotic heart disease of native coronary artery without angina pectoris: Secondary | ICD-10-CM

## 2018-10-28 MED ORDER — TICAGRELOR 90 MG PO TABS: 90.0000 mg | ORAL_TABLET | Freq: Two times a day (BID) | ORAL | 3 refills | Status: DC

## 2018-10-28 MED ORDER — METOPROLOL TARTRATE 25 MG PO TABS: 25.0000 mg | ORAL_TABLET | Freq: Two times a day (BID) | ORAL | 3 refills | Status: DC

## 2018-10-28 NOTE — Patient Instructions (Addendum)

## 2018-11-02 DIAGNOSIS — G4733 Obstructive sleep apnea (adult) (pediatric): Secondary | ICD-10-CM | POA: Diagnosis not present

## 2018-11-25 DIAGNOSIS — I10 Essential (primary) hypertension: Secondary | ICD-10-CM | POA: Diagnosis not present

## 2018-11-25 DIAGNOSIS — R7301 Impaired fasting glucose: Secondary | ICD-10-CM | POA: Diagnosis not present

## 2018-11-25 DIAGNOSIS — Z008 Encounter for other general examination: Secondary | ICD-10-CM | POA: Diagnosis not present

## 2018-11-25 DIAGNOSIS — E782 Mixed hyperlipidemia: Secondary | ICD-10-CM | POA: Diagnosis not present

## 2018-11-28 ENCOUNTER — Other Ambulatory Visit: Payer: Self-pay | Admitting: Cardiovascular Disease

## 2018-11-28 NOTE — Telephone Encounter (Signed)
Outpatient Medication Detail    Disp Refills Start End   metoprolol tartrate (LOPRESSOR) 25 MG tablet 180 tablet 3 10/28/2018    Sig - Route: Take 1 tablet (25 mg total) by mouth 2 (two) times daily. - Oral   Sent to pharmacy as: metoprolol tartrate (LOPRESSOR) 25 MG tablet   E-Prescribing Status: Receipt confirmed by pharmacy (10/28/2018 9:19 AM EST)   Pharmacy   [place], [place]

## 2018-12-01 DIAGNOSIS — H52223 Regular astigmatism, bilateral: Secondary | ICD-10-CM | POA: Diagnosis not present

## 2018-12-01 DIAGNOSIS — H524 Presbyopia: Secondary | ICD-10-CM | POA: Diagnosis not present

## 2018-12-01 DIAGNOSIS — H5203 Hypermetropia, bilateral: Secondary | ICD-10-CM | POA: Diagnosis not present

## 2018-12-03 DIAGNOSIS — G4733 Obstructive sleep apnea (adult) (pediatric): Secondary | ICD-10-CM | POA: Diagnosis not present

## 2018-12-17 DIAGNOSIS — S62622A Displaced fracture of medial phalanx of right middle finger, initial encounter for closed fracture: Secondary | ICD-10-CM | POA: Diagnosis not present

## 2018-12-17 DIAGNOSIS — Z6825 Body mass index (BMI) 25.0-25.9, adult: Secondary | ICD-10-CM | POA: Diagnosis not present

## 2018-12-17 DIAGNOSIS — S62632A Displaced fracture of distal phalanx of right middle finger, initial encounter for closed fracture: Secondary | ICD-10-CM | POA: Diagnosis not present

## 2018-12-22 DIAGNOSIS — Z6825 Body mass index (BMI) 25.0-25.9, adult: Secondary | ICD-10-CM | POA: Diagnosis not present

## 2018-12-22 DIAGNOSIS — S62632A Displaced fracture of distal phalanx of right middle finger, initial encounter for closed fracture: Secondary | ICD-10-CM | POA: Diagnosis not present

## 2018-12-26 DIAGNOSIS — S62632A Displaced fracture of distal phalanx of right middle finger, initial encounter for closed fracture: Secondary | ICD-10-CM | POA: Diagnosis not present

## 2018-12-30 ENCOUNTER — Other Ambulatory Visit: Payer: Self-pay | Admitting: Orthopedic Surgery

## 2018-12-30 DIAGNOSIS — S62632A Displaced fracture of distal phalanx of right middle finger, initial encounter for closed fracture: Secondary | ICD-10-CM

## 2018-12-31 ENCOUNTER — Ambulatory Visit
Admission: RE | Admit: 2018-12-31 | Discharge: 2018-12-31 | Disposition: A | Discharge status: Home / Self Care | Payer: BLUE CROSS/BLUE SHIELD | Source: Ambulatory Visit | Attending: Orthopedic Surgery | Admitting: Orthopedic Surgery

## 2018-12-31 DIAGNOSIS — S62612A Displaced fracture of proximal phalanx of right middle finger, initial encounter for closed fracture: Secondary | ICD-10-CM | POA: Diagnosis not present

## 2018-12-31 DIAGNOSIS — S62632A Displaced fracture of distal phalanx of right middle finger, initial encounter for closed fracture: Secondary | ICD-10-CM

## 2019-01-01 DIAGNOSIS — G4733 Obstructive sleep apnea (adult) (pediatric): Secondary | ICD-10-CM | POA: Diagnosis not present

## 2019-01-05 ENCOUNTER — Encounter (HOSPITAL_BASED_OUTPATIENT_CLINIC_OR_DEPARTMENT_OTHER): Payer: Self-pay | Admitting: *Deleted

## 2019-01-05 ENCOUNTER — Telehealth: Payer: Self-pay | Admitting: Cardiovascular Disease

## 2019-01-05 ENCOUNTER — Other Ambulatory Visit: Payer: Self-pay

## 2019-01-05 DIAGNOSIS — S62622D Displaced fracture of medial phalanx of right middle finger, subsequent encounter for fracture with routine healing: Secondary | ICD-10-CM | POA: Diagnosis not present

## 2019-01-05 NOTE — Telephone Encounter (Signed)
     [facility] Pre-operative Risk Assessment    Request for surgical clearance:  1. What type of surgery is being performed? Open reduction internal fixation possible volar arthoplasty of right middle finger   2. When is this surgery scheduled? 01/08/19  3. What type of clearance is required (medical clearance vs. Pharmacy clearance to hold med vs. Both)? Pharmacy clearance  4. Are there any medications that need to be held prior to surgery and how long? Brilinta  5. Practice name and name of physician performing surgery? Dr Fredna Dow, [place]   6. What is your office phone number [phone]   7.   What is your office fax number [phone]  8.   Anesthesia type (None, local, MAC, general) ? Block or general    Gwendel Hanson 01/05/2019, 11:29 AM  _________________________________________________________________   (provider comments below)

## 2019-01-05 NOTE — Telephone Encounter (Signed)
Dr. Johnsie Cancel - is it ok to hold Brilinta for surgery 5 fays?  Please send answer to pre-op

## 2019-01-06 NOTE — Telephone Encounter (Signed)
He may hold Brilinta for 5 days prior to hand surgery.  It is been greater than 1 year since his last stent placement. Candee Furbish, MD covering for Dr. Johnsie Cancel

## 2019-01-06 NOTE — Telephone Encounter (Signed)
   Primary Cardiologist: Jenkins Rouge, MD  Chart reviewed as part of pre-operative protocol coverage. Patient was contacted 01/06/2019 in reference to pre-operative risk assessment for pending surgery as outlined below.  Jeff Prince was last seen on 10/28/18 by Dr. Johnsie Cancel.  Since that day, Jeff Prince has done well from cardiac perspective. He does have CAD but has been greater than 1 year since stent was placed so he may hold for 5 days prior..  Therefore, based on ACC/AHA guidelines, the patient would be at acceptable risk for the planned procedure without further cardiovascular testing.   I will route this recommendation to the requesting party via Epic fax function and remove from pre-op pool.  Please call with questions.  Cecilie Kicks, NP 01/06/2019, 11:40 AM

## 2019-01-07 ENCOUNTER — Other Ambulatory Visit: Payer: Self-pay | Admitting: Orthopedic Surgery

## 2019-01-08 ENCOUNTER — Ambulatory Visit (HOSPITAL_BASED_OUTPATIENT_CLINIC_OR_DEPARTMENT_OTHER)
Admission: RE | Admit: 2019-01-08 | Discharge: 2019-01-08 | Disposition: A | Discharge status: Home / Self Care | Payer: BLUE CROSS/BLUE SHIELD | Attending: Orthopedic Surgery | Admitting: Orthopedic Surgery

## 2019-01-08 ENCOUNTER — Encounter (HOSPITAL_BASED_OUTPATIENT_CLINIC_OR_DEPARTMENT_OTHER)
Admission: RE | Disposition: A | Discharge status: Home / Self Care | Payer: Self-pay | Source: Home / Self Care | Attending: Orthopedic Surgery

## 2019-01-08 ENCOUNTER — Other Ambulatory Visit: Payer: Self-pay

## 2019-01-08 ENCOUNTER — Ambulatory Visit (HOSPITAL_BASED_OUTPATIENT_CLINIC_OR_DEPARTMENT_OTHER): Payer: BLUE CROSS/BLUE SHIELD | Admitting: Anesthesiology

## 2019-01-08 ENCOUNTER — Encounter (HOSPITAL_BASED_OUTPATIENT_CLINIC_OR_DEPARTMENT_OTHER): Payer: Self-pay | Admitting: *Deleted

## 2019-01-08 DIAGNOSIS — Z833 Family history of diabetes mellitus: Secondary | ICD-10-CM | POA: Diagnosis not present

## 2019-01-08 DIAGNOSIS — Z87891 Personal history of nicotine dependence: Secondary | ICD-10-CM | POA: Insufficient documentation

## 2019-01-08 DIAGNOSIS — W11XXXA Fall on and from ladder, initial encounter: Secondary | ICD-10-CM | POA: Diagnosis not present

## 2019-01-08 DIAGNOSIS — Z801 Family history of malignant neoplasm of trachea, bronchus and lung: Secondary | ICD-10-CM | POA: Insufficient documentation

## 2019-01-08 DIAGNOSIS — Z8249 Family history of ischemic heart disease and other diseases of the circulatory system: Secondary | ICD-10-CM | POA: Diagnosis not present

## 2019-01-08 DIAGNOSIS — S62602A Fracture of unspecified phalanx of right middle finger, initial encounter for closed fracture: Secondary | ICD-10-CM | POA: Diagnosis not present

## 2019-01-08 DIAGNOSIS — I2510 Atherosclerotic heart disease of native coronary artery without angina pectoris: Secondary | ICD-10-CM | POA: Diagnosis not present

## 2019-01-08 DIAGNOSIS — I10 Essential (primary) hypertension: Secondary | ICD-10-CM | POA: Diagnosis not present

## 2019-01-08 DIAGNOSIS — F419 Anxiety disorder, unspecified: Secondary | ICD-10-CM | POA: Insufficient documentation

## 2019-01-08 DIAGNOSIS — E785 Hyperlipidemia, unspecified: Secondary | ICD-10-CM | POA: Diagnosis not present

## 2019-01-08 DIAGNOSIS — Z955 Presence of coronary angioplasty implant and graft: Secondary | ICD-10-CM | POA: Diagnosis not present

## 2019-01-08 DIAGNOSIS — Z9049 Acquired absence of other specified parts of digestive tract: Secondary | ICD-10-CM | POA: Diagnosis not present

## 2019-01-08 DIAGNOSIS — G4733 Obstructive sleep apnea (adult) (pediatric): Secondary | ICD-10-CM | POA: Diagnosis not present

## 2019-01-08 DIAGNOSIS — S62622A Displaced fracture of medial phalanx of right middle finger, initial encounter for closed fracture: Secondary | ICD-10-CM | POA: Insufficient documentation

## 2019-01-08 DIAGNOSIS — G8918 Other acute postprocedural pain: Secondary | ICD-10-CM | POA: Diagnosis not present

## 2019-01-08 HISTORY — DX: Sleep apnea, unspecified: G47.30

## 2019-01-08 HISTORY — DX: Displaced fracture of proximal phalanx of right middle finger, subsequent encounter for fracture with malunion: S62.612P

## 2019-01-08 HISTORY — PX: OPEN REDUCTION INTERNAL FIXATION (ORIF) FINGER WITH RADIAL BONE GRAFT: SHX5666

## 2019-01-08 SURGERY — OPEN REDUCTION INTERNAL FIXATION (ORIF) FINGER WITH RADIAL BONE GRAFT
Anesthesia: Regional | Site: Hand | Laterality: Right

## 2019-01-08 MED ORDER — ONDANSETRON HCL 4 MG/2ML IJ SOLN
INTRAMUSCULAR | Status: DC | PRN
  Administered 2019-01-08: 4 mg via INTRAVENOUS

## 2019-01-08 MED ORDER — BUPIVACAINE HCL (PF) 0.25 % IJ SOLN
INTRAMUSCULAR | Status: DC | PRN
  Administered 2019-01-08: 6 mL

## 2019-01-08 MED ORDER — ONDANSETRON HCL 4 MG/2ML IJ SOLN: 4.0000 mg | Freq: Once | INTRAMUSCULAR | Status: DC | PRN

## 2019-01-08 MED ORDER — FENTANYL CITRATE (PF) 100 MCG/2ML IJ SOLN
INTRAMUSCULAR | Status: AC
  Filled 2019-01-08: qty 2

## 2019-01-08 MED ORDER — MIDAZOLAM HCL 2 MG/2ML IJ SOLN
INTRAMUSCULAR | Status: AC
  Filled 2019-01-08: qty 2

## 2019-01-08 MED ORDER — FENTANYL CITRATE (PF) 100 MCG/2ML IJ SOLN
50.0000 ug | INTRAMUSCULAR | Status: DC | PRN
  Administered 2019-01-08 (×2): 50 ug via INTRAVENOUS

## 2019-01-08 MED ORDER — ONDANSETRON HCL 4 MG/2ML IJ SOLN
INTRAMUSCULAR | Status: AC
  Filled 2019-01-08: qty 2

## 2019-01-08 MED ORDER — LACTATED RINGERS IV SOLN
INTRAVENOUS | Status: DC
  Administered 2019-01-08: 09:00:00 via INTRAVENOUS

## 2019-01-08 MED ORDER — DEXAMETHASONE SODIUM PHOSPHATE 10 MG/ML IJ SOLN
INTRAMUSCULAR | Status: DC | PRN
  Administered 2019-01-08: 10 mg via INTRAVENOUS

## 2019-01-08 MED ORDER — ROPIVACAINE HCL 5 MG/ML IJ SOLN
INTRAMUSCULAR | Status: DC | PRN
  Administered 2019-01-08: 30 mL via PERINEURAL

## 2019-01-08 MED ORDER — CEFAZOLIN SODIUM-DEXTROSE 2-4 GM/100ML-% IV SOLN
2.0000 g | INTRAVENOUS | Status: AC
  Administered 2019-01-08: 2 g via INTRAVENOUS

## 2019-01-08 MED ORDER — CEFAZOLIN SODIUM-DEXTROSE 2-4 GM/100ML-% IV SOLN
INTRAVENOUS | Status: AC
  Filled 2019-01-08: qty 100

## 2019-01-08 MED ORDER — CHLORHEXIDINE GLUCONATE 4 % EX LIQD: 60.0000 mL | Freq: Once | CUTANEOUS | Status: DC

## 2019-01-08 MED ORDER — GLYCOPYRROLATE PF 0.2 MG/ML IJ SOSY
PREFILLED_SYRINGE | INTRAMUSCULAR | Status: DC | PRN
  Administered 2019-01-08: .2 mg via INTRAVENOUS

## 2019-01-08 MED ORDER — HYDROCODONE-ACETAMINOPHEN 5-325 MG PO TABS: 1.0000 | ORAL_TABLET | Freq: Four times a day (QID) | ORAL | 0 refills | Status: DC | PRN

## 2019-01-08 MED ORDER — OXYCODONE HCL 5 MG PO TABS: 5.0000 mg | ORAL_TABLET | Freq: Once | ORAL | Status: DC | PRN

## 2019-01-08 MED ORDER — OXYCODONE HCL 5 MG/5ML PO SOLN: 5.0000 mg | Freq: Once | ORAL | Status: DC | PRN

## 2019-01-08 MED ORDER — PROPOFOL 500 MG/50ML IV EMUL
INTRAVENOUS | Status: DC | PRN
  Administered 2019-01-08: 100 ug/kg/min via INTRAVENOUS

## 2019-01-08 MED ORDER — SCOPOLAMINE 1 MG/3DAYS TD PT72: 1.0000 | MEDICATED_PATCH | Freq: Once | TRANSDERMAL | Status: DC | PRN

## 2019-01-08 MED ORDER — FENTANYL CITRATE (PF) 100 MCG/2ML IJ SOLN: 25.0000 ug | INTRAMUSCULAR | Status: DC | PRN

## 2019-01-08 MED ORDER — DEXAMETHASONE SODIUM PHOSPHATE 10 MG/ML IJ SOLN
INTRAMUSCULAR | Status: AC
  Filled 2019-01-08: qty 1

## 2019-01-08 MED ORDER — MIDAZOLAM HCL 2 MG/2ML IJ SOLN
1.0000 mg | INTRAMUSCULAR | Status: DC | PRN
  Administered 2019-01-08: 2 mg via INTRAVENOUS
  Administered 2019-01-08 (×2): 1 mg via INTRAVENOUS

## 2019-01-08 SURGICAL SUPPLY — 66 items
APL PRP STRL LF DISP 70% ISPRP (MISCELLANEOUS) ×2
BIT DRILL 10 (BIT) ×2
BIT DRILL 10MM (BIT) ×1 IMPLANT
BIT DRILL 8MM (BIT) ×1 IMPLANT
BLADE MINI RND TIP GREEN BEAV (BLADE) ×3 IMPLANT
BLADE SURG 15 STRL LF DISP TIS (BLADE) ×2 IMPLANT
BLADE SURG 15 STRL SS (BLADE) ×3
BNDG CMPR 9X4 STRL LF SNTH (GAUZE/BANDAGES/DRESSINGS) ×2
BNDG COHESIVE 3X5 TAN STRL LF (GAUZE/BANDAGES/DRESSINGS) ×3 IMPLANT
BNDG ESMARK 4X9 LF (GAUZE/BANDAGES/DRESSINGS) ×3 IMPLANT
BNDG GAUZE ELAST 4 BULKY (GAUZE/BANDAGES/DRESSINGS) IMPLANT
BUR FAST CUTTING MED (BURR) IMPLANT
CHLORAPREP W/TINT 26 (MISCELLANEOUS) ×3 IMPLANT
CORD BIPOLAR FORCEPS 12FT (ELECTRODE) ×3 IMPLANT
COVER BACK TABLE 60X90IN (DRAPES) ×3 IMPLANT
COVER MAYO STAND STRL (DRAPES) ×5 IMPLANT
COVER WAND RF STERILE (DRAPES) IMPLANT
CUFF TOURN SGL QUICK 18X4 (TOURNIQUET CUFF) IMPLANT
DECANTER SPIKE VIAL GLASS SM (MISCELLANEOUS) IMPLANT
DRAPE EXTREMITY T 121X128X90 (DISPOSABLE) ×3 IMPLANT
DRAPE OEC MINIVIEW 54X84 (DRAPES) ×3 IMPLANT
DRAPE SURG 17X23 STRL (DRAPES) ×3 IMPLANT
DRILL BIT 10MM (BIT) ×3
DRILL BIT 8MM (BIT) ×3
GAUZE SPONGE 4X4 12PLY STRL (GAUZE/BANDAGES/DRESSINGS) ×3 IMPLANT
GAUZE XEROFORM 1X8 LF (GAUZE/BANDAGES/DRESSINGS) ×3 IMPLANT
GLOVE BIO SURGEON STRL SZ7.5 (GLOVE) ×2 IMPLANT
GLOVE BIOGEL PI IND STRL 8 (GLOVE) ×2 IMPLANT
GLOVE BIOGEL PI IND STRL 8.5 (GLOVE) ×2 IMPLANT
GLOVE BIOGEL PI INDICATOR 8 (GLOVE) ×2
GLOVE BIOGEL PI INDICATOR 8.5 (GLOVE) ×1
GLOVE SURG ORTHO 8.0 STRL STRW (GLOVE) ×3 IMPLANT
GLOVE SURG SYN 8.0 (GLOVE) ×3 IMPLANT
GLOVE SURG SYN 8.0 PF PI (GLOVE) IMPLANT
GOWN STRL REIN XL XLG (GOWN DISPOSABLE) ×2 IMPLANT
GOWN STRL REUS W/ TWL LRG LVL3 (GOWN DISPOSABLE) ×1 IMPLANT
GOWN STRL REUS W/TWL LRG LVL3 (GOWN DISPOSABLE)
GOWN STRL REUS W/TWL XL LVL3 (GOWN DISPOSABLE) ×5 IMPLANT
LOOP VESSEL MAXI BLUE (MISCELLANEOUS) ×2 IMPLANT
NDL PRECISIONGLIDE 27X1.5 (NEEDLE) IMPLANT
NEEDLE PRECISIONGLIDE 27X1.5 (NEEDLE) ×3 IMPLANT
NS IRRIG 1000ML POUR BTL (IV SOLUTION) ×3 IMPLANT
PACK BASIN DAY SURGERY FS (CUSTOM PROCEDURE TRAY) ×3 IMPLANT
PAD CAST 3X4 CTTN HI CHSV (CAST SUPPLIES) ×2 IMPLANT
PADDING CAST ABS 3INX4YD NS (CAST SUPPLIES)
PADDING CAST ABS 4INX4YD NS (CAST SUPPLIES)
PADDING CAST ABS COTTON 3X4 (CAST SUPPLIES) IMPLANT
PADDING CAST ABS COTTON 4X4 ST (CAST SUPPLIES) ×1 IMPLANT
PADDING CAST COTTON 3X4 STRL (CAST SUPPLIES) ×3
SCREW SELF TAP CORTEX 1.0 10MM (Screw) ×2 IMPLANT
SCREW SELF TAP CORTEX 1.0 7MM (Screw) ×2 IMPLANT
SCREW SELF TAP CORTEX 1.0 8MM (Screw) ×2 IMPLANT
SHEET MEDIUM DRAPE 40X70 STRL (DRAPES) ×2 IMPLANT
SLEEVE SCD COMPRESS KNEE MED (MISCELLANEOUS) ×2 IMPLANT
SPLINT PLASTER CAST XFAST 3X15 (CAST SUPPLIES) IMPLANT
SPLINT PLASTER XTRA FASTSET 3X (CAST SUPPLIES)
STOCKINETTE 4X48 STRL (DRAPES) ×3 IMPLANT
SUT CHROMIC 5 0 P 3 (SUTURE) IMPLANT
SUT ETHILON 4 0 PS 2 18 (SUTURE) ×3 IMPLANT
SUT MERSILENE 4 0 P 3 (SUTURE) IMPLANT
SUT SILK 2 0 PERMA HAND 18 BK (SUTURE) ×2 IMPLANT
SUT VICRYL 4-0 PS2 18IN ABS (SUTURE) ×3 IMPLANT
SYR BULB 3OZ (MISCELLANEOUS) ×3 IMPLANT
SYR CONTROL 10ML LL (SYRINGE) ×2 IMPLANT
TOWEL GREEN STERILE FF (TOWEL DISPOSABLE) ×3 IMPLANT
UNDERPAD 30X30 (UNDERPADS AND DIAPERS) ×3 IMPLANT

## 2019-01-08 NOTE — H&P (Signed)
Jeff Prince is an 58 y.o. male.   Chief Complaint: pain right middle finger HPI: Jeff Prince is a 58 year old right-hand-dominant male referred by Dr. Case for consultation regarding a fracture dislocation of the PIP joint of his right middle finger. The injury occurred on 12/06/2018. He fell from a ladder jamming his finger against a lawnmower. He did not seek medical attention simply taped his fingers together. He states he had continued soreness swelling was subsequently seen at rocking him where x-rays were taken on 12/07/1818. This revealed the fracture. He saw Dr. Huston Foley on 2 12/22/2018. The x-rays revealed the fracture dislocation attempted reduction was unsuccessful. He has referred him for continued care. Has no prior history of injury. He complains of a sharp pain with a VAS score of 5/10 with attempted flexion. This localized at the proximal inner phalangeal joint of his middle finger. He is not taking anything for pain or discomfort. He has no history of diabetes thyroid problems arthritis or gout. His family history is positive diabetes negative for the remainder. He has been tested.He was referred for CT scan and effort to evaluate fracture dislocation. Is now 4-1/2 weeks following the injury. Complains of mild discomfort with attempted flexion. This at the proximal inner phalangeal joint. He has a prior history of injury to the [facility] area of his right hand.     Past Medical History:  Diagnosis Date  . Anxiety   . CAD (coronary artery disease)    11/18 cutting balloon to pLAD ISR, with DESx1 to mLAD, normal EF  . Displaced fracture of proximal phalanx of right middle finger, subsequent encounter for fracture with malunion   . HLD (hyperlipidemia)   . HTN (hypertension)   . Pneumonia 1997   S/P gallbladder OR  . Sleep apnea    OSA uses CPAP nightly    Past Surgical History:  Procedure Laterality Date  . BILE DUCT STENT PLACEMENT  1997  . CORONARY ANGIOPLASTY WITH STENT PLACEMENT  2010;  09/03/2017   [place];   . CORONARY STENT INTERVENTION N/A 09/03/2017   Procedure: CORONARY STENT INTERVENTION;  Surgeon: Jettie Booze, MD;  Location: [place] [facility];  Service: Cardiovascular;  Laterality: N/A;  . FLEXIBLE SIGMOIDOSCOPY     at age of 24 in [place]  . [place]   with stent, RMR  . LEFT HEART CATH AND CORONARY ANGIOGRAPHY N/A 09/03/2017   Procedure: LEFT HEART CATH AND CORONARY ANGIOGRAPHY;  Surgeon: Jettie Booze, MD;  Location: [place] [facility];  Service: Cardiovascular;  Laterality: N/A;    Family History  Problem Relation Age of Onset  . Cancer Father        lung  . Diabetes Mother   . Hyperlipidemia Sister   . Hyperlipidemia Brother   . Hyperlipidemia Brother   . Heart attack Paternal Grandmother   . Heart attack Paternal Uncle   . Colon cancer Neg Hx   . Anesthesia problems Neg Hx   . Malignant hyperthermia Neg Hx   . Hypotension Neg Hx   . Pseudochol deficiency Neg Hx    Social History:  reports that he has quit smoking. His smoking use included cigarettes. He has a 13.50 pack-year smoking history. He has never used smokeless tobacco. He reports current alcohol use of about 21.0 standard drinks of alcohol per week. He reports that he does not use drugs.  Allergies: No Known Allergies  No medications prior to admission.    No results found for this  or any previous visit (from the past 48 hour(s)).  No results found.   Pertinent items are noted in HPI.  Height 6' (1.829 m), weight 86.2 kg.  General appearance: alert, cooperative and appears stated age Head: Normocephalic, without obvious abnormality Neck: no JVD Resp: clear to auscultation bilaterally Cardio: regular rate and rhythm, S1, S2 normal, no murmur, click, rub or gallop GI: soft, non-tender; bowel sounds normal; no masses,  no organomegaly Extremities: right middle finger swelling Pulses: 2+ and symmetric Skin: Skin color, texture, turgor  normal. No rashes or lesions Neurologic: Grossly normal Incision/Wound: NA  Assessment/Plan Assessment:  1. Closed displaced fracture of middle phalanx of right middle finger with routine healing, subsequent encounter    Plan: His CT scan precludes the ability to use the hamate as a graft site. We have discussed exploration possible osteotomy and fixation of the fracture fragment if it is not large enough to stabilize it if not then a volar plate arthroplasty is the second option. He is aware there is no guarantee to the surgery the possibility of infection recurrence injury to arteries nerves tendons complete relief symptoms due to the loss of mobility anticipated with a volar plate arthroplasty possibility of arthritis arthritic changes in the possibility of fusion of the joint or arthroplasty at a later date. Questions are encouraged and answered and it is scheduled as an outpatient under regional anesthesia for open reduction of the fracture possible volar plate reconstruction PIP joint right middle finger    Daryll Brod 01/08/2019, 5:31 AM

## 2019-01-08 NOTE — Anesthesia Postprocedure Evaluation (Signed)
Anesthesia Post Note  Patient: Jeff Prince  Procedure(s) Performed: OPEN REDUCTION INTERNAL FIXATION (ORIF) FINGER RIGHT (Right Hand)     Patient location during evaluation: PACU Anesthesia Type: Regional and General Level of consciousness: awake and alert Pain management: pain level controlled Vital Signs Assessment: post-procedure vital signs reviewed and stable Respiratory status: spontaneous breathing, nonlabored ventilation and respiratory function stable Cardiovascular status: blood pressure returned to baseline and stable Postop Assessment: no apparent nausea or vomiting Anesthetic complications: no    Last Vitals:  Vitals:   01/08/19 1249 01/08/19 1306  BP:  (!) 136/93  Pulse: 77   Resp: 16 18  Temp:  36.6 C  SpO2: 95% 100%    Last Pain:  Vitals:   01/08/19 1306  TempSrc:   PainSc: 0-No pain                 Lidia Collum

## 2019-01-08 NOTE — Anesthesia Procedure Notes (Signed)
Procedure Name: LMA Insertion Date/Time: 01/08/2019 11:36 AM Performed by: Lyndee Leo, CRNA Pre-anesthesia Checklist: Patient identified, Emergency Drugs available, Suction available and Patient being monitored Patient Re-evaluated:Patient Re-evaluated prior to induction Oxygen Delivery Method: Circle system utilized Preoxygenation: Pre-oxygenation with 100% oxygen Induction Type: IV induction Ventilation: Mask ventilation without difficulty LMA: LMA inserted LMA Size: 4.0 Number of attempts: 1 Airway Equipment and Method: Bite block Placement Confirmation: positive ETCO2 Tube secured with: Tape Dental Injury: Teeth and Oropharynx as per pre-operative assessment

## 2019-01-08 NOTE — Progress Notes (Signed)
Assisted Dr. Witman with right, ultrasound guided, supraclavicular block. Side rails up, monitors on throughout procedure. See vital signs in flow sheet. Tolerated Procedure well. 

## 2019-01-08 NOTE — Anesthesia Procedure Notes (Addendum)
Anesthesia Regional Block: Supraclavicular block   Pre-Anesthetic Checklist: ,, timeout performed, Correct Patient, Correct Site, Correct Laterality, Correct Procedure, Correct Position, site marked, Risks and benefits discussed,  Surgical consent,  Pre-op evaluation,  At surgeon's request and post-op pain management  Laterality: Right  Prep: chloraprep       Needles:  Injection technique: Single-shot  Needle Type: Echogenic Stimulator Needle     Needle Length: 9cm  Needle Gauge: 21     Additional Needles:   Procedures:,,,, ultrasound used (permanent image in chart),,,,  Narrative:  Start time: 01/08/2019 10:03 AM End time: 01/08/2019 10:07 AM Injection made incrementally with aspirations every 5 mL.  Performed by: Personally  Anesthesiologist: Lidia Collum, MD  Additional Notes: Monitors applied. Injection made in 5cc increments. No resistance to injection. Good needle visualization. Patient tolerated procedure well.

## 2019-01-08 NOTE — Op Note (Signed)
Radiographic note    image intensification fluoroscopy was used to identify the position of the joint fixation AP lateral and oblique directions.  The dorsal fragment was also assessed in its position.  This was to the right middle finger.

## 2019-01-08 NOTE — Op Note (Signed)
NAME: Jeff Prince MEDICAL RECORD NO: [id number] DATE OF BIRTH: Apr 22, 1961 FACILITY: [facility] LOCATION: [facility] PHYSICIAN: Wynonia Sours, MD   OPERATIVE REPORT   DATE OF PROCEDURE: 01/08/19    PREOPERATIVE DIAGNOSIS:   Fracture dislocation PIP joint right middle finger   POSTOPERATIVE DIAGNOSIS:   Same   PROCEDURE:   Open reduction internal fixation fracture middle phalanx articular surface right middle finger   SURGEON: Daryll Brod, M.D.   ASSISTANT: Leanora Cover, MD   ANESTHESIA:  General with regional and Local   INTRAVENOUS FLUIDS:  Per anesthesia flow sheet.   ESTIMATED BLOOD LOSS:  Minimal.   COMPLICATIONS:  None.   SPECIMENS:  none   TOURNIQUET TIME:    Total Tourniquet Time Documented: Upper Arm (Right) - 41 minutes Total: Upper Arm (Right) - 41 minutes    DISPOSITION:  Stable to PACU.   INDICATIONS: Patient is a 58 year old male who sustained a fracture dislocation of the PIP joint of his right middle finger approximately 4-1/2 weeks ago.  Seek medical attention.  He is seen with the finger partially healed with subluxation of the middle phalanx with dislocation of the PIP joint.  Admitted for possible open reduction internal fixation versus volar plate arthroplasty a hemi-hamate is not available due to an old fracture with arthritic change of the [facility] joint of the ring and small fingers right hand.  Is aware that there is no guarantee to the surgery the possibility of infection recurrence injury to arteries nerves tendons complete relief symptoms dystrophy.  Aware that this is a severe injury may require a fusion of the joint in the future due to arthritic changes were arthroplasty with prosthetic replacement.  Preoperative area the patient is seen extremity marked by both patient and surgeon antibiotic was given.  OPERATIVE COURSE: A supraclavicular block was carried out without difficulty in the preoperative area under the direction the  anesthesia department.  Was brought to the operating room where is placed supine position with the right arm free.  He had feeling a general anesthetic was given.  The prep was done with ChloraPrep a three-minute dry time was allowed and a timeout taken to confirm patient procedure.  The limb was exsanguinated with an Esmarch bandage turn placed on the arm was inflated to 250 mmHg.  The volar Bruner incision was made carried down through subcutaneous tissue.  Bleeders were electrocauterized with bipolar.  The neurovascular bundles were identified.  The flexor sheath with the A2 to the A4 pulley isolated.  Was then incised on its ulnar border.  Left attached radially.  This allowed visualization of the flexor tendons.  The volar plate was then incised off from the middle phalanx.  The collateral ligaments were then believed from the middle phalanx and elevated using periosteal elevator.  The joint was then shotgunned open.  This allowed visualization of the fracture which was teased apart with a freer elevator.  This was then placed distally and reduced.  Three 1 mm screws were then placed after measuring these measured 7 8 and 9 mm.  Articular surface looked very good.  The joint was reduced x-rays taken AP lateral and oblique direction revealed that the dorsal attachment of the central slip was fractured off was such a small piece could not be repaired.  Decided to treat this conservatively the joint itself was well reduced in good position.  Decided that a pin was unable to be placed due to the placement of the screws and  would not be allowed to cross into the middle phalanx for stabilization.  Wound was copiously irrigated with saline.  The sheath was then placed over the flexor tendons which had been drawn out of the way using a vessel loop drain for traction.  The wound was irrigated and closed interrupted 4-0 nylon sutures.  Local infiltration metacarpal block was given quarter percent bupivacaine without  epinephrine approximately 8 cc was used.  A sterile compressive dorsal palmar dressing splint was applied and deflation of the tourniquet all fingers immediately pink.  He was taken to the recovery room for observation in satisfactory condition.  He will be discharged home to return the hand center of [facility] in 1 week IV ibuprofen Tylenol with Norco as a backup.   Daryll Brod, MD Electronically signed, 01/08/19

## 2019-01-08 NOTE — Brief Op Note (Signed)
01/08/2019  12:28 PM  PATIENT:  Jeff Prince  58 y.o. male  PRE-OPERATIVE DIAGNOSIS:  FRACTURE DISLOCATION RIGHT MIDDLE FINGER  POST-OPERATIVE DIAGNOSIS:  FRACTURE DISLOCATION RIGHT MIDDLE FINGER  PROCEDURE:  Procedure(s) with comments: OPEN REDUCTION INTERNAL FIXATION (ORIF) FINGER RIGHT (Right) - AXILLARY BLOCK  SURGEON:  Surgeon(s) and Role:    * Daryll Brod, MD - Primary    * Leanora Cover, MD - Assisting  PHYSICIAN ASSISTANT:   ASSISTANTS: K Kuza,MD   ANESTHESIA:   local, regional and general  EBL: 1 ml BLOOD ADMINISTERED:none  DRAINS: none   LOCAL MEDICATIONS USED:  BUPIVICAINE   SPECIMEN:  No Specimen  DISPOSITION OF SPECIMEN:  N/A  COUNTS:  YES  TOURNIQUET:   Total Tourniquet Time Documented: Upper Arm (Right) - 41 minutes Total: Upper Arm (Right) - 41 minutes   DICTATION: .Viviann Spare Dictation  PLAN OF CARE: Discharge to home after PACU  PATIENT DISPOSITION:  PACU - hemodynamically stable.

## 2019-01-08 NOTE — Op Note (Signed)
I assisted Surgeon(s) and Role:    * Daryll Brod, MD - Primary    Leanora Cover, MD - Assisting on the Procedure(s): OPEN REDUCTION INTERNAL FIXATION (ORIF) FINGER RIGHT on 01/08/2019.  I provided assistance on this case as follows: retraction soft tissues, placement hardware.  Electronically signed by: Leanora Cover, MD Date: 01/08/2019 Time: 12:25 PM

## 2019-01-08 NOTE — Discharge Instructions (Addendum)
Hand Center Instructions Hand Surgery  Wound Care: Keep your hand elevated above the level of your heart.  Do not allow it to dangle by your side.  Keep the dressing dry and do not remove it unless your doctor advises you to do so.  He will usually change it at the time of your post-op visit.  Moving your fingers is advised to stimulate circulation but will depend on the site of your surgery.  If you have a splint applied, your doctor will advise you regarding movement.  Activity: Do not drive or operate machinery today.  Rest today and then you may return to your normal activity and work as indicated by your physician.  Diet:  Drink liquids today or eat a light diet.  You may resume a regular diet tomorrow.    General expectations: Pain for two to three days. Fingers may become slightly swollen.  Call your doctor if any of the following occur: Severe pain not relieved by pain medication. Elevated temperature. Dressing soaked with blood. Inability to move fingers. White or bluish color to fingers.    Post Anesthesia Home Care Instructions  Activity: Get plenty of rest for the remainder of the day. A responsible individual must stay with you for 24 hours following the procedure.  For the next 24 hours, DO NOT: -Drive a car -Paediatric nurse -Drink alcoholic beverages -Take any medication unless instructed by your physician -Make any legal decisions or sign important papers.  Meals: Start with liquid foods such as gelatin or soup. Progress to regular foods as tolerated. Avoid greasy, spicy, heavy foods. If nausea and/or vomiting occur, drink only clear liquids until the nausea and/or vomiting subsides. Call your physician if vomiting continues.  Special Instructions/Symptoms: Your throat may feel dry or sore from the anesthesia or the breathing tube placed in your throat during surgery. If this causes discomfort, gargle with warm salt water. The discomfort should disappear  within 24 hours.  If you had a scopolamine patch placed behind your ear for the management of post- operative nausea and/or vomiting:  1. The medication in the patch is effective for 72 hours, after which it should be removed.  Wrap patch in a tissue and discard in the trash. Wash hands thoroughly with soap and water. 2. You may remove the patch earlier than 72 hours if you experience unpleasant side effects which may include dry mouth, dizziness or visual disturbances. 3. Avoid touching the patch. Wash your hands with soap and water after contact with the patch.    Call your surgeon if you experience:   1.  Fever over 101.0. 2.  Inability to urinate. 3.  Nausea and/or vomiting. 4.  Extreme swelling or bruising at the surgical site. 5.  Continued bleeding from the incision. 6.  Increased pain, redness or drainage from the incision. 7.  Problems related to your pain medication. 8.  Any problems and/or concerns  Regional Anesthesia Blocks  1. Numbness or the inability to move the "blocked" extremity may last from 3-48 hours after placement. The length of time depends on the medication injected and your individual response to the medication. If the numbness is not going away after 48 hours, call your surgeon.  2. The extremity that is blocked will need to be protected until the numbness is gone and the  Strength has returned. Because you cannot feel it, you will need to take extra care to avoid injury. Because it may be weak, you may have difficulty moving it  or using it. You may not know what position it is in without looking at it while the block is in effect.  3. For blocks in the legs and feet, returning to weight bearing and walking needs to be done carefully. You will need to wait until the numbness is entirely gone and the strength has returned. You should be able to move your leg and foot normally before you try and bear weight or walk. You will need someone to be with you when you  first try to ensure you do not fall and possibly risk injury.  4. Bruising and tenderness at the needle site are common side effects and will resolve in a few days.  5. Persistent numbness or new problems with movement should be communicated to the surgeon or the [place] [phone] [place] [phone]).

## 2019-01-08 NOTE — Transfer of Care (Signed)
Immediate Anesthesia Transfer of Care Note  Patient: Jeff Prince  Procedure(s) Performed: OPEN REDUCTION INTERNAL FIXATION (ORIF) FINGER RIGHT (Right Hand)  Patient Location: PACU  Anesthesia Type:GA combined with regional for post-op pain  Level of Consciousness: awake, alert  and oriented  Airway & Oxygen Therapy: Patient Spontanous Breathing and Patient connected to face mask oxygen  Post-op Assessment: Report given to RN and Post -op Vital signs reviewed and stable  Post vital signs: Reviewed and stable  Last Vitals:  Vitals Value Taken Time  BP    Temp    Pulse 81 01/08/2019 12:27 PM  Resp 14 01/08/2019 12:27 PM  SpO2 95 % 01/08/2019 12:27 PM  Vitals shown include unvalidated device data.  Last Pain:  Vitals:   01/08/19 0927  TempSrc: Oral  PainSc: 0-No pain      Patients Stated Pain Goal: 0 (01/00/71 2197)  Complications: No apparent anesthesia complications

## 2019-01-08 NOTE — Anesthesia Preprocedure Evaluation (Signed)
Anesthesia Evaluation  Patient identified by MRN, date of birth, ID band Patient awake    Reviewed: Allergy & Precautions, NPO status , Patient's Chart, lab work & pertinent test results, reviewed documented beta blocker date and time   History of Anesthesia Complications Negative for: history of anesthetic complications  Airway Mallampati: II  TM Distance: >3 FB Neck ROM: Full    Dental no notable dental hx.    Pulmonary sleep apnea , former smoker,    Pulmonary exam normal        Cardiovascular hypertension, Pt. on medications and Pt. on home beta blockers + CAD and + Cardiac Stents (2011 and repeat in 2018)  Normal cardiovascular exam     Neuro/Psych PSYCHIATRIC DISORDERS Anxiety negative neurological ROS     GI/Hepatic negative GI ROS, Neg liver ROS,   Endo/Other  negative endocrine ROS  Renal/GU negative Renal ROS  negative genitourinary   Musculoskeletal negative musculoskeletal ROS (+)   Abdominal   Peds  Hematology negative hematology ROS (+)   Anesthesia Other Findings Cleared by cardiology at acceptable risk  Reproductive/Obstetrics                             Anesthesia Physical Anesthesia Plan  ASA: III  Anesthesia Plan: Regional   Post-op Pain Management:  Regional for Post-op pain   Induction:   PONV Risk Score and Plan: 1 and Propofol infusion and Treatment may vary due to age or medical condition  Airway Management Planned: Nasal Cannula and Simple Face Mask  Additional Equipment: None  Intra-op Plan:   Post-operative Plan:   Informed Consent: I have reviewed the patients History and Physical, chart, labs and discussed the procedure including the risks, benefits and alternatives for the proposed anesthesia with the patient or authorized representative who has indicated his/her understanding and acceptance.       Plan Discussed with:   Anesthesia Plan  Comments:         Anesthesia Quick Evaluation

## 2019-01-09 ENCOUNTER — Encounter (HOSPITAL_BASED_OUTPATIENT_CLINIC_OR_DEPARTMENT_OTHER): Payer: Self-pay | Admitting: Orthopedic Surgery

## 2019-01-09 NOTE — Addendum Note (Signed)
Addendum  created 01/09/19 0731 by Verita Lamb, CRNA   Charge Capture section accepted

## 2019-01-16 DIAGNOSIS — S62622D Displaced fracture of medial phalanx of right middle finger, subsequent encounter for fracture with routine healing: Secondary | ICD-10-CM | POA: Diagnosis not present

## 2019-01-16 DIAGNOSIS — M79644 Pain in right finger(s): Secondary | ICD-10-CM | POA: Diagnosis not present

## 2019-02-01 DIAGNOSIS — G4733 Obstructive sleep apnea (adult) (pediatric): Secondary | ICD-10-CM | POA: Diagnosis not present

## 2019-02-09 DIAGNOSIS — S62622D Displaced fracture of medial phalanx of right middle finger, subsequent encounter for fracture with routine healing: Secondary | ICD-10-CM | POA: Diagnosis not present

## 2019-03-02 DIAGNOSIS — M79644 Pain in right finger(s): Secondary | ICD-10-CM | POA: Diagnosis not present

## 2019-03-02 DIAGNOSIS — S62622D Displaced fracture of medial phalanx of right middle finger, subsequent encounter for fracture with routine healing: Secondary | ICD-10-CM | POA: Diagnosis not present

## 2019-03-02 DIAGNOSIS — M25641 Stiffness of right hand, not elsewhere classified: Secondary | ICD-10-CM | POA: Diagnosis not present

## 2019-03-03 DIAGNOSIS — G4733 Obstructive sleep apnea (adult) (pediatric): Secondary | ICD-10-CM | POA: Diagnosis not present

## 2019-03-12 DIAGNOSIS — I10 Essential (primary) hypertension: Secondary | ICD-10-CM | POA: Diagnosis not present

## 2019-03-12 DIAGNOSIS — Z1389 Encounter for screening for other disorder: Secondary | ICD-10-CM | POA: Diagnosis not present

## 2019-03-12 DIAGNOSIS — E781 Pure hyperglyceridemia: Secondary | ICD-10-CM | POA: Diagnosis not present

## 2019-03-12 DIAGNOSIS — I2510 Atherosclerotic heart disease of native coronary artery without angina pectoris: Secondary | ICD-10-CM | POA: Diagnosis not present

## 2019-03-12 DIAGNOSIS — E663 Overweight: Secondary | ICD-10-CM | POA: Diagnosis not present

## 2019-03-12 DIAGNOSIS — Z6826 Body mass index (BMI) 26.0-26.9, adult: Secondary | ICD-10-CM | POA: Diagnosis not present

## 2019-03-16 DIAGNOSIS — M25641 Stiffness of right hand, not elsewhere classified: Secondary | ICD-10-CM | POA: Diagnosis not present

## 2019-03-16 DIAGNOSIS — M79644 Pain in right finger(s): Secondary | ICD-10-CM | POA: Diagnosis not present

## 2019-03-16 DIAGNOSIS — S62622D Displaced fracture of medial phalanx of right middle finger, subsequent encounter for fracture with routine healing: Secondary | ICD-10-CM | POA: Diagnosis not present

## 2019-03-25 DIAGNOSIS — S62622D Displaced fracture of medial phalanx of right middle finger, subsequent encounter for fracture with routine healing: Secondary | ICD-10-CM | POA: Diagnosis not present

## 2019-04-08 DIAGNOSIS — R7309 Other abnormal glucose: Secondary | ICD-10-CM | POA: Diagnosis not present

## 2019-04-08 DIAGNOSIS — Z79899 Other long term (current) drug therapy: Secondary | ICD-10-CM | POA: Diagnosis not present

## 2019-04-08 DIAGNOSIS — E559 Vitamin D deficiency, unspecified: Secondary | ICD-10-CM | POA: Diagnosis not present

## 2019-04-08 DIAGNOSIS — R7301 Impaired fasting glucose: Secondary | ICD-10-CM | POA: Diagnosis not present

## 2019-04-08 DIAGNOSIS — E782 Mixed hyperlipidemia: Secondary | ICD-10-CM | POA: Diagnosis not present

## 2019-04-09 DIAGNOSIS — E559 Vitamin D deficiency, unspecified: Secondary | ICD-10-CM | POA: Diagnosis not present

## 2019-04-09 DIAGNOSIS — I10 Essential (primary) hypertension: Secondary | ICD-10-CM | POA: Diagnosis not present

## 2019-04-09 DIAGNOSIS — R7301 Impaired fasting glucose: Secondary | ICD-10-CM | POA: Diagnosis not present

## 2019-04-09 DIAGNOSIS — E782 Mixed hyperlipidemia: Secondary | ICD-10-CM | POA: Diagnosis not present

## 2019-06-30 DIAGNOSIS — E559 Vitamin D deficiency, unspecified: Secondary | ICD-10-CM | POA: Diagnosis not present

## 2019-06-30 DIAGNOSIS — I10 Essential (primary) hypertension: Secondary | ICD-10-CM | POA: Diagnosis not present

## 2019-06-30 DIAGNOSIS — Z139 Encounter for screening, unspecified: Secondary | ICD-10-CM | POA: Diagnosis not present

## 2019-06-30 DIAGNOSIS — E782 Mixed hyperlipidemia: Secondary | ICD-10-CM | POA: Diagnosis not present

## 2019-06-30 DIAGNOSIS — R7301 Impaired fasting glucose: Secondary | ICD-10-CM | POA: Diagnosis not present

## 2019-06-30 DIAGNOSIS — Z79899 Other long term (current) drug therapy: Secondary | ICD-10-CM | POA: Diagnosis not present

## 2019-07-02 DIAGNOSIS — E782 Mixed hyperlipidemia: Secondary | ICD-10-CM | POA: Diagnosis not present

## 2019-07-02 DIAGNOSIS — Z139 Encounter for screening, unspecified: Secondary | ICD-10-CM | POA: Diagnosis not present

## 2019-08-11 DIAGNOSIS — E782 Mixed hyperlipidemia: Secondary | ICD-10-CM | POA: Diagnosis not present

## 2019-08-11 DIAGNOSIS — R7301 Impaired fasting glucose: Secondary | ICD-10-CM | POA: Diagnosis not present

## 2019-08-11 DIAGNOSIS — Z79899 Other long term (current) drug therapy: Secondary | ICD-10-CM | POA: Diagnosis not present

## 2019-08-11 DIAGNOSIS — Z0130 Encounter for examination of blood pressure without abnormal findings: Secondary | ICD-10-CM | POA: Diagnosis not present

## 2019-09-22 DIAGNOSIS — E559 Vitamin D deficiency, unspecified: Secondary | ICD-10-CM | POA: Diagnosis not present

## 2019-09-22 DIAGNOSIS — I10 Essential (primary) hypertension: Secondary | ICD-10-CM | POA: Diagnosis not present

## 2019-09-22 DIAGNOSIS — Z79899 Other long term (current) drug therapy: Secondary | ICD-10-CM | POA: Diagnosis not present

## 2019-09-28 ENCOUNTER — Other Ambulatory Visit: Payer: Self-pay | Admitting: Cardiovascular Disease

## 2019-10-07 ENCOUNTER — Other Ambulatory Visit: Payer: Self-pay | Admitting: Cardiovascular Disease

## 2019-10-07 DIAGNOSIS — R7989 Other specified abnormal findings of blood chemistry: Secondary | ICD-10-CM | POA: Diagnosis not present

## 2019-10-07 DIAGNOSIS — E559 Vitamin D deficiency, unspecified: Secondary | ICD-10-CM | POA: Diagnosis not present

## 2019-11-12 DIAGNOSIS — D2272 Melanocytic nevi of left lower limb, including hip: Secondary | ICD-10-CM | POA: Diagnosis not present

## 2019-11-12 DIAGNOSIS — L905 Scar conditions and fibrosis of skin: Secondary | ICD-10-CM | POA: Diagnosis not present

## 2019-11-12 DIAGNOSIS — L821 Other seborrheic keratosis: Secondary | ICD-10-CM | POA: Diagnosis not present

## 2019-12-01 NOTE — Progress Notes (Signed)
CARDIOLOGY OFFICE NOTE  Date:  12/09/2019    Jeff Prince Date of Birth: 03/08/1961 Medical Record [id number]  PCP:  Sharilyn Sites, MD  Cardiologist:  Johnsie Cancel    No chief complaint on file.   History of Present Illness:  59 y.o.  CAD with DES to proximal LAD in 2011 Repeat cath 09/03/17 ISR and new stent to mid LAD.  DAT until at least 08/2018. EF normal   CRF;s include HTN, HLD.  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 Anxiety   . CAD (coronary artery disease)    11/18 cutting balloon to pLAD ISR, with DESx1 to mLAD, normal EF  . Displaced fracture of proximal phalanx of right middle finger, subsequent encounter for fracture with malunion   . HLD (hyperlipidemia)   . HTN (hypertension)   . Pneumonia 1997   S/P gallbladder OR  . Sleep apnea    OSA uses CPAP nightly    Past Surgical History:  Procedure Laterality Date  . BILE DUCT STENT PLACEMENT  1997  . CORONARY ANGIOPLASTY WITH STENT PLACEMENT  2010; 09/03/2017   [place];   . CORONARY STENT INTERVENTION N/A 09/03/2017   Procedure: CORONARY STENT INTERVENTION;  Surgeon: Jettie Booze, MD;  Location: [facility];  Service: Cardiovascular;  Laterality: N/A;  . FLEXIBLE SIGMOIDOSCOPY     at age of 10 in [place]  . [place]   with stent, RMR  . LEFT HEART CATH AND CORONARY ANGIOGRAPHY N/A 09/03/2017   Procedure: LEFT HEART CATH AND CORONARY ANGIOGRAPHY;  Surgeon: Jettie Booze, MD;   Location: [facility];  Service: Cardiovascular;  Laterality: N/A;  . OPEN REDUCTION INTERNAL FIXATION (ORIF) FINGER WITH RADIAL BONE GRAFT Right 01/08/2019   Procedure: OPEN REDUCTION INTERNAL FIXATION (ORIF) FINGER RIGHT;  Surgeon: Daryll Brod, MD;  Location: [facility];  Service: Orthopedics;  Laterality: Right;  AXILLARY BLOCK     Medications: Current Meds  Medication Sig  . ALPRAZolam (XANAX) 0.5 MG tablet Take 0.25-0.5 mg daily as needed by mouth for anxiety or sleep.   Marland Kitchen aspirin 81 MG tablet Take 81 mg daily by mouth.   . Cholecalciferol (VITAMIN D) 2000 UNITS CAPS Take 2,000 Units daily by mouth.   . Coenzyme Q10 (COQ10) 200 MG CAPS Take 200 mg daily by mouth.  . fenofibrate 160 MG tablet Take 1 tablet (160 mg total) by mouth daily.  Vanessa Kick Ethyl (VASCEPA PO) Take 1 g by mouth 4 (four) times daily.  Marland Kitchen losartan (COZAAR) 50 MG tablet Take 1 tablet (50 mg total) by mouth daily.  . metoprolol tartrate (LOPRESSOR) 25 MG tablet Take 1 tablet (25 mg total) by mouth 2 (two) times daily. Please keep upcoming appt in February with Dr. Johnsie Cancel before anymore refills. Thank you  . Multiple Vitamin (MULTIVITAMIN) tablet Take 1 tablet by mouth daily.    . rosuvastatin (CRESTOR) 20 MG tablet Take 20 mg by mouth  daily.  . vitamin E 400 UNIT capsule Take 400 Units daily by mouth.  . [DISCONTINUED] BRILINTA 90 MG TABS tablet TAKE 1 TABLET BY MOUTH TWO  TIMES DAILY  . [DISCONTINUED] omega-3 acid ethyl esters (LOVAZA) 1 g capsule Take 2 g 2 (two) times daily by mouth.     Allergies: No Known Allergies  Social History: The patient  reports that he has quit smoking. His smoking use included cigarettes. He has a 13.50 pack-year smoking history. He has never used smokeless tobacco. He reports current alcohol use of about 21.0 standard drinks of alcohol per week. He reports that he does not use drugs.   Family History: The patient's family history includes Cancer in his  father; Diabetes in his mother; Heart attack in his paternal grandmother and paternal uncle; Hyperlipidemia in his brother, brother, and sister.   Review of Systems: Please see the history of present illness.   Otherwise, the review of systems is positive for none.   All other systems are reviewed and negative.   Physical Exam: VS:  BP 120/84   Pulse 63   Ht 6' (1.829 m)   Wt 191 lb 9.6 oz (86.9 kg)   SpO2 99%   BMI 25.99 kg/m  .  BMI Body mass index is 25.99 kg/m.  Wt Readings from Last 3 Encounters:  12/09/19 191 lb 9.6 oz (86.9 kg)  01/08/19 191 lb 5.8 oz (86.8 kg)  10/28/18 192 lb 6.4 oz (87.3 kg)   Affect appropriate Healthy:  appears stated age HEENT: normal Neck supple with no adenopathy JVP normal no bruits no thyromegaly Lungs clear with no wheezing and good diaphragmatic motion Heart:  S1/S2 no murmur, no rub, gallop or click PMI normal Abdomen: benighn, BS positve, no tenderness, no AAA no bruit.  No HSM or HJR Distal pulses intact with no bruits No edema Neuro non-focal Skin warm and dry No muscular weakness    Current Outpatient Medications:  .  ALPRAZolam (XANAX) 0.5 MG tablet, Take 0.25-0.5 mg daily as needed by mouth for anxiety or sleep. , Disp: , Rfl:  .  aspirin 81 MG tablet, Take 81 mg daily by mouth. , Disp: , Rfl:  .  Cholecalciferol (VITAMIN D) 2000 UNITS CAPS, Take 2,000 Units daily by mouth. , Disp: , Rfl:  .  Coenzyme Q10 (COQ10) 200 MG CAPS, Take 200 mg daily by mouth., Disp: , Rfl:  .  fenofibrate 160 MG tablet, Take 1 tablet (160 mg total) by mouth daily., Disp: 90 tablet, Rfl: 3 .  Icosapent Ethyl (VASCEPA PO), Take 1 g by mouth 4 (four) times daily., Disp: , Rfl:  .  losartan (COZAAR) 50 MG tablet, Take 1 tablet (50 mg total) by mouth daily., Disp: 90 tablet, Rfl: 3 .  metoprolol tartrate (LOPRESSOR) 25 MG tablet, Take 1 tablet (25 mg total) by mouth 2 (two) times daily. Please keep upcoming appt in February with Dr. Johnsie Cancel before anymore  refills. Thank you, Disp: 60 tablet, Rfl: 2 .  Multiple Vitamin (MULTIVITAMIN) tablet, Take 1 tablet by mouth daily.  , Disp: , Rfl:  .  rosuvastatin (CRESTOR) 20 MG tablet, Take 20 mg by mouth daily., Disp: , Rfl:  .  vitamin E 400 UNIT capsule, Take 400 Units daily by mouth., Disp: , Rfl:  .  clopidogrel (PLAVIX) 75 MG tablet, Take 1 tablet (75 mg total) by mouth daily., Disp: 90 tablet, Rfl: 3 .  nitroGLYCERIN (NITROSTAT) 0.4 MG SL tablet, Place 1 tablet (0.4 mg  total) under the tongue every 5 (five) minutes as needed for chest pain., Disp: 25 tablet, Rfl: 3  LABORATORY DATA:  EKG:  10/28/18  SR rate 70 PVC otherwise normal  12/08/18 SR rate 63 normal    Lab Results  Component Value Date   WBC 7.0 09/04/2017   HGB 13.0 09/04/2017   HCT 38.9 (L) 09/04/2017   PLT 184 09/04/2017   GLUCOSE 117 (H) 09/04/2017   NA 137 09/04/2017   K 3.7 09/04/2017   CL 106 09/04/2017   CREATININE 1.15 09/04/2017   BUN 11 09/04/2017   CO2 25 09/04/2017   TSH 1.481 Test methodology is 3rd generation TSH 03/01/2010   INR 1.0 08/30/2017    Other Studies Reviewed Today:  CORONARY STENT INTERVENTION 08/2017  LEFT HEART CATH AND CORONARY ANGIOGRAPHY  Conclusion     Prox LAD-1 lesion is 70% stenosed. FFR of LAD 0.77.  Previously placed Prox LAD-2 stent has an area of proximal stent restenosis, 70%, treated with a 3.5 x 10 Wolverine.  Post intervention, there is a 10% residual stenosis.  Mid LAD lesion is 75% stenosed. This was treated with a 3.0 x 16 Synergy drug-eluting stent, overlapping the distal edge of the previously placed stent from 2011.  A stent was successfully placed.  Post intervention, there is a 0% residual stenosis.  The left ventricular systolic function is normal.  LV end diastolic pressure is normal.  The left ventricular ejection fraction is 55-65% by visual estimate.  There is no aortic valve stenosis.  Unable to navigate right radial due to severe vasospasm.    Continue dual antiplatelet therapy for 12 months.  Brilinta was used since IV Cangrelor was used during the procedure.  If there is an issue with Brilinta such as cost, could switch to clopidogrel after 30 days.  Continue aggressive secondary prevention.     Assessment/Plan:  1.  CAD - ISR proximal and mid LAD 08/2017 stop Brillinta and change to generic plavix   2. HTN - Well controlled.  Continue current medications and low sodium Dash type diet.    3. HLD - on statin labs with primary   4. Anxiety/panic disorder - PRN xanax   Jenkins Rouge

## 2019-12-09 ENCOUNTER — Other Ambulatory Visit: Payer: Self-pay

## 2019-12-09 ENCOUNTER — Encounter: Payer: Self-pay | Admitting: Cardiovascular Disease

## 2019-12-09 ENCOUNTER — Ambulatory Visit: Payer: BC Managed Care – PPO | Admitting: Cardiovascular Disease

## 2019-12-09 VITALS — BP 120/84 | HR 63 | Ht 72.0 in | Wt 191.6 lb

## 2019-12-09 DIAGNOSIS — I2510 Atherosclerotic heart disease of native coronary artery without angina pectoris: Secondary | ICD-10-CM

## 2019-12-09 MED ORDER — CLOPIDOGREL BISULFATE 75 MG PO TABS: 75.0000 mg | ORAL_TABLET | Freq: Every day | ORAL | 3 refills | Status: DC

## 2019-12-09 NOTE — Patient Instructions (Addendum)
Medication Instructions:  Your physician has recommended you make the following change in your medication:  1-STOP Brilinta 2-START Plavix 75 mg by mouth daily  *If you need a refill on your cardiac medications before your next appointment, please call your pharmacy*  Lab Work:  If you have labs (blood work) drawn today and your tests are completely normal, you will receive your results only by: Marland Kitchen MyChart Message (if you have MyChart) OR . A paper copy in the mail If you have any lab test that is abnormal or we need to change your treatment, we will call you to review the results.  Testing/Procedures: None ordered today.  Follow-Up: At [facility], you and your health needs are our priority.  As part of our continuing mission to provide you with exceptional heart care, we have created designated Provider Care Teams.  These Care Teams include your primary Cardiologist (physician) and Advanced Practice Providers (APPs -  Physician Assistants and Nurse Practitioners) who all work together to provide you with the care you need, when you need it.  Your next appointment:   12 month(s)  The format for your next appointment:   In Person  Provider:   You may see Jenkins Rouge, MD or one of the following Advanced Practice Providers on your designated Care Team:    Truitt Merle, NP  Cecilie Kicks, NP  Kathyrn Drown, NP

## 2019-12-10 ENCOUNTER — Other Ambulatory Visit: Payer: Self-pay | Admitting: Cardiovascular Disease

## 2019-12-27 ENCOUNTER — Other Ambulatory Visit: Payer: Self-pay | Admitting: Cardiovascular Disease

## 2020-01-12 ENCOUNTER — Other Ambulatory Visit: Payer: Self-pay | Admitting: Cardiovascular Disease

## 2020-02-11 DIAGNOSIS — C44519 Basal cell carcinoma of skin of other part of trunk: Secondary | ICD-10-CM | POA: Diagnosis not present

## 2020-09-01 DIAGNOSIS — Z23 Encounter for immunization: Secondary | ICD-10-CM | POA: Diagnosis not present

## 2020-10-17 DIAGNOSIS — Z1389 Encounter for screening for other disorder: Secondary | ICD-10-CM | POA: Diagnosis not present

## 2020-10-17 DIAGNOSIS — I10 Essential (primary) hypertension: Secondary | ICD-10-CM | POA: Diagnosis not present

## 2020-10-17 DIAGNOSIS — G4739 Other sleep apnea: Secondary | ICD-10-CM | POA: Diagnosis not present

## 2020-10-17 DIAGNOSIS — Z1331 Encounter for screening for depression: Secondary | ICD-10-CM | POA: Diagnosis not present

## 2020-10-17 DIAGNOSIS — I2510 Atherosclerotic heart disease of native coronary artery without angina pectoris: Secondary | ICD-10-CM | POA: Diagnosis not present

## 2020-10-17 DIAGNOSIS — Z6826 Body mass index (BMI) 26.0-26.9, adult: Secondary | ICD-10-CM | POA: Diagnosis not present

## 2020-10-17 DIAGNOSIS — E781 Pure hyperglyceridemia: Secondary | ICD-10-CM | POA: Diagnosis not present

## 2020-10-17 DIAGNOSIS — R7309 Other abnormal glucose: Secondary | ICD-10-CM | POA: Diagnosis not present

## 2020-10-17 DIAGNOSIS — E785 Hyperlipidemia, unspecified: Secondary | ICD-10-CM | POA: Diagnosis not present

## 2020-11-11 DIAGNOSIS — L570 Actinic keratosis: Secondary | ICD-10-CM | POA: Diagnosis not present

## 2020-11-11 DIAGNOSIS — D692 Other nonthrombocytopenic purpura: Secondary | ICD-10-CM | POA: Diagnosis not present

## 2020-11-11 DIAGNOSIS — L821 Other seborrheic keratosis: Secondary | ICD-10-CM | POA: Diagnosis not present

## 2020-11-11 DIAGNOSIS — D2272 Melanocytic nevi of left lower limb, including hip: Secondary | ICD-10-CM | POA: Diagnosis not present

## 2020-12-19 NOTE — Progress Notes (Signed)
CARDIOLOGY OFFICE NOTE  Date:  12/22/2020    Jeff Prince Date of Birth: 20-Feb-1961 Medical Record #[id number]  PCP:  Sharilyn Sites, MD  Cardiologist:  Johnsie Cancel    No chief complaint on file.   History of Present Illness:  60 y.o.  CAD with DES to proximal LAD in 2011 Repeat cath 09/03/17 ISR and new stent to mid LAD. EF normal   CRF;s include HTN, HLD.  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 Anxiety   . CAD (coronary artery disease)    11/18 cutting balloon to pLAD ISR, with DESx1 to mLAD, normal EF  . Displaced fracture of proximal phalanx of right middle finger, subsequent encounter for fracture with malunion   . HLD (hyperlipidemia)   . HTN (hypertension)   . Pneumonia 1997   S/P gallbladder OR  . Sleep apnea    OSA uses CPAP nightly    Past Surgical History:  Procedure Laterality Date  . BILE DUCT STENT PLACEMENT  1997  . CORONARY ANGIOPLASTY WITH STENT PLACEMENT  2010; 09/03/2017   [place];   . CORONARY STENT INTERVENTION N/A 09/03/2017   Procedure: CORONARY STENT INTERVENTION;  Surgeon: Jettie Booze, MD;  Location: [facility];  Service: Cardiovascular;  Laterality: N/A;  . FLEXIBLE SIGMOIDOSCOPY     at age of 43 in [place]  . [place]   with stent, RMR  . LEFT HEART CATH AND CORONARY ANGIOGRAPHY N/A 09/03/2017   Procedure: LEFT HEART CATH AND CORONARY  ANGIOGRAPHY;  Surgeon: Jettie Booze, MD;  Location: [facility];  Service: Cardiovascular;  Laterality: N/A;  . OPEN REDUCTION INTERNAL FIXATION (ORIF) FINGER WITH RADIAL BONE GRAFT Right 01/08/2019   Procedure: OPEN REDUCTION INTERNAL FIXATION (ORIF) FINGER RIGHT;  Surgeon: Daryll Brod, MD;  Location: [facility];  Service: Orthopedics;  Laterality: Right;  AXILLARY BLOCK     Medications: No outpatient medications have been marked as taking for the 12/22/20 encounter (Appointment) with Josue Hector, MD.     Allergies: No Known Allergies  Social History: The patient  reports that he has quit smoking. His smoking use included cigarettes. He has a 13.50 pack-year smoking history. He has never used smokeless tobacco. He reports current alcohol use of about 21.0 standard drinks of alcohol per week. He reports that he does not use drugs.   Family History: The patient's family history includes Cancer in his father; Diabetes in his mother; Heart attack in his paternal grandmother and paternal uncle; Hyperlipidemia in his brother, brother, and sister.   Review of Systems: Please see the history of present illness.   Otherwise, the review of systems is positive for none.   All other systems are reviewed and negative.   Physical Exam: VS:  There were no vitals taken for this visit. Marland Kitchen  BMI There is no height or weight on file  to calculate BMI.  Wt Readings from Last 3 Encounters:  12/09/19 86.9 kg  01/08/19 86.8 kg  10/28/18 87.3 kg   Affect appropriate Healthy:  appears stated age 56: normal Neck supple with no adenopathy JVP normal no bruits no thyromegaly Lungs clear with no wheezing and good diaphragmatic motion Heart:  S1/S2 no murmur, no rub, gallop or click PMI normal Abdomen: benighn, BS positve, no tenderness, no AAA no bruit.  No HSM or HJR Distal pulses intact with no bruits No edema Neuro non-focal Skin warm and dry No muscular  weakness    Current Outpatient Medications:  .  ALPRAZolam (XANAX) 0.5 MG tablet, Take 0.25-0.5 mg daily as needed by mouth for anxiety or sleep. , Disp: , Rfl:  .  aspirin 81 MG tablet, Take 81 mg daily by mouth. , Disp: , Rfl:  .  Cholecalciferol (VITAMIN D) 2000 UNITS CAPS, Take 2,000 Units daily by mouth. , Disp: , Rfl:  .  clopidogrel (PLAVIX) 75 MG tablet, Take 1 tablet (75 mg total) by mouth daily., Disp: 90 tablet, Rfl: 3 .  Coenzyme Q10 (COQ10) 200 MG CAPS, Take 200 mg daily by mouth., Disp: , Rfl:  .  fenofibrate 160 MG tablet, Take 1 tablet (160 mg total) by mouth daily., Disp: 90 tablet, Rfl: 3 .  Icosapent Ethyl (VASCEPA PO), Take 1 g by mouth 4 (four) times daily., Disp: , Rfl:  .  losartan (COZAAR) 50 MG tablet, TAKE ONE TABLET BY MOUTH ONCE DAILY., Disp: 90 tablet, Rfl: 3 .  metoprolol tartrate (LOPRESSOR) 25 MG tablet, Take 1 tablet (25 mg total) by mouth 2 (two) times daily., Disp: 180 tablet, Rfl: 3 .  Multiple Vitamin (MULTIVITAMIN) tablet, Take 1 tablet by mouth daily.  , Disp: , Rfl:  .  nitroGLYCERIN (NITROSTAT) 0.4 MG SL tablet, PLACE 1 TAB UNDER TONGUE EVERY 5 MIN IF NEEDED FOR CHEST PAIN. MAY USE 3 TIMES.NO RELIEF CALL 911., Disp: 25 tablet, Rfl: 6 .  rosuvastatin (CRESTOR) 20 MG tablet, Take 20 mg by mouth daily., Disp: , Rfl:  .  vitamin E 400 UNIT capsule, Take 400 Units daily by mouth., Disp: , Rfl:   LABORATORY DATA:  EKG:  10/28/18  SR rate 70 PVC otherwise normal  12/08/18 SR rate 63 normal  12/22/2020 SR rate 65 PVC otherwise normal    Lab Results  Component Value Date   WBC 7.0 09/04/2017   HGB 13.0 09/04/2017   HCT 38.9 (L) 09/04/2017   PLT 184 09/04/2017   GLUCOSE 117 (H) 09/04/2017   NA 137 09/04/2017   K 3.7 09/04/2017   CL 106 09/04/2017   CREATININE 1.15 09/04/2017   BUN 11 09/04/2017   CO2 25 09/04/2017   TSH 1.481 Test methodology is 3rd generation TSH 03/01/2010   INR 1.0 08/30/2017    Other Studies Reviewed Today:  CORONARY STENT  INTERVENTION 08/2017  LEFT HEART CATH AND CORONARY ANGIOGRAPHY  Conclusion     Prox LAD-1 lesion is 70% stenosed. FFR of LAD 0.77.  Previously placed Prox LAD-2 stent has an area of proximal stent restenosis, 70%, treated with a 3.5 x 10 Wolverine.  Post intervention, there is a 10% residual stenosis.  Mid LAD lesion is 75% stenosed. This was treated with a 3.0 x 16 Synergy drug-eluting stent, overlapping the distal edge of the previously placed stent from 2011.  A stent was successfully placed.  Post intervention, there is a 0% residual stenosis.  The left ventricular systolic function is normal.  LV end diastolic pressure is normal.  The left ventricular ejection fraction is 55-65% by visual estimate.  There is no aortic valve stenosis.  Unable to navigate right radial due to severe vasospasm.   Continue dual antiplatelet therapy for 12 months.  Brilinta was used since IV Cangrelor was used during the procedure.  If there is an issue with Brilinta such as cost, could switch to clopidogrel after 30 days.  Continue aggressive secondary prevention.     Assessment/Plan:  1.  CAD - ISR proximal and mid LAD 08/2017 no angina continue ASA/Plavix    2. HTN - Well controlled.  Continue current medications and low sodium Dash type diet.    3. HLD - on statin labs with primary   4. Anxiety/panic disorder - PRN xanax   F/U in a year   Jenkins Rouge

## 2020-12-22 ENCOUNTER — Ambulatory Visit: Payer: BC Managed Care – PPO | Admitting: Cardiovascular Disease

## 2020-12-22 ENCOUNTER — Encounter: Payer: Self-pay | Admitting: Cardiovascular Disease

## 2020-12-22 ENCOUNTER — Other Ambulatory Visit: Payer: Self-pay

## 2020-12-22 VITALS — BP 122/82 | HR 65 | Ht 72.0 in | Wt 194.0 lb

## 2020-12-22 DIAGNOSIS — I2510 Atherosclerotic heart disease of native coronary artery without angina pectoris: Secondary | ICD-10-CM

## 2020-12-22 MED ORDER — LOSARTAN POTASSIUM 50 MG PO TABS: 50.0000 mg | ORAL_TABLET | Freq: Every day | ORAL | 3 refills | Status: DC

## 2020-12-22 MED ORDER — FENOFIBRATE 160 MG PO TABS: 160.0000 mg | ORAL_TABLET | Freq: Every day | ORAL | 3 refills | Status: DC

## 2020-12-22 MED ORDER — ROSUVASTATIN CALCIUM 20 MG PO TABS: 20.0000 mg | ORAL_TABLET | Freq: Every day | ORAL | 3 refills | Status: DC

## 2020-12-22 MED ORDER — CLOPIDOGREL BISULFATE 75 MG PO TABS: 75.0000 mg | ORAL_TABLET | Freq: Every day | ORAL | 3 refills | Status: DC

## 2020-12-22 MED ORDER — METOPROLOL TARTRATE 25 MG PO TABS: 25.0000 mg | ORAL_TABLET | Freq: Two times a day (BID) | ORAL | 3 refills | Status: DC

## 2020-12-22 NOTE — Patient Instructions (Signed)

## 2020-12-22 NOTE — Addendum Note (Signed)
Addended by: Jacinta Shoe on: 12/22/2020 04:01 PM   Modules accepted: Orders

## 2021-02-01 DIAGNOSIS — R945 Abnormal results of liver function studies: Secondary | ICD-10-CM | POA: Diagnosis not present

## 2021-02-01 DIAGNOSIS — Z6825 Body mass index (BMI) 25.0-25.9, adult: Secondary | ICD-10-CM | POA: Diagnosis not present

## 2021-02-01 DIAGNOSIS — E785 Hyperlipidemia, unspecified: Secondary | ICD-10-CM | POA: Diagnosis not present

## 2021-02-01 DIAGNOSIS — E781 Pure hyperglyceridemia: Secondary | ICD-10-CM | POA: Diagnosis not present

## 2021-02-01 DIAGNOSIS — Z0001 Encounter for general adult medical examination with abnormal findings: Secondary | ICD-10-CM | POA: Diagnosis not present

## 2021-02-01 DIAGNOSIS — Z1389 Encounter for screening for other disorder: Secondary | ICD-10-CM | POA: Diagnosis not present

## 2021-02-01 DIAGNOSIS — I10 Essential (primary) hypertension: Secondary | ICD-10-CM | POA: Diagnosis not present

## 2021-02-01 DIAGNOSIS — I2510 Atherosclerotic heart disease of native coronary artery without angina pectoris: Secondary | ICD-10-CM | POA: Diagnosis not present

## 2021-02-01 DIAGNOSIS — R7309 Other abnormal glucose: Secondary | ICD-10-CM | POA: Diagnosis not present

## 2021-02-01 DIAGNOSIS — Z1331 Encounter for screening for depression: Secondary | ICD-10-CM | POA: Diagnosis not present

## 2021-02-06 ENCOUNTER — Other Ambulatory Visit (HOSPITAL_COMMUNITY): Payer: Self-pay | Admitting: Family Medicine

## 2021-02-06 DIAGNOSIS — R945 Abnormal results of liver function studies: Secondary | ICD-10-CM

## 2021-02-06 DIAGNOSIS — R7989 Other specified abnormal findings of blood chemistry: Secondary | ICD-10-CM

## 2021-02-13 ENCOUNTER — Other Ambulatory Visit: Payer: Self-pay

## 2021-02-13 ENCOUNTER — Ambulatory Visit (HOSPITAL_COMMUNITY)
Admission: RE | Admit: 2021-02-13 | Discharge: 2021-02-13 | Disposition: A | Discharge status: Home / Self Care | Payer: BC Managed Care – PPO | Source: Ambulatory Visit | Attending: Family Medicine | Admitting: Family Medicine

## 2021-02-13 DIAGNOSIS — R945 Abnormal results of liver function studies: Secondary | ICD-10-CM | POA: Insufficient documentation

## 2021-02-13 DIAGNOSIS — R7989 Other specified abnormal findings of blood chemistry: Secondary | ICD-10-CM

## 2021-02-13 DIAGNOSIS — I10 Essential (primary) hypertension: Secondary | ICD-10-CM | POA: Diagnosis not present

## 2021-02-13 DIAGNOSIS — K760 Fatty (change of) liver, not elsewhere classified: Secondary | ICD-10-CM | POA: Diagnosis not present

## 2021-02-13 DIAGNOSIS — Z9049 Acquired absence of other specified parts of digestive tract: Secondary | ICD-10-CM | POA: Diagnosis not present

## 2021-11-14 ENCOUNTER — Encounter: Payer: Self-pay | Admitting: *Deleted

## 2021-11-14 DIAGNOSIS — L821 Other seborrheic keratosis: Secondary | ICD-10-CM | POA: Diagnosis not present

## 2021-11-14 DIAGNOSIS — L570 Actinic keratosis: Secondary | ICD-10-CM | POA: Diagnosis not present

## 2021-11-14 DIAGNOSIS — D692 Other nonthrombocytopenic purpura: Secondary | ICD-10-CM | POA: Diagnosis not present

## 2021-11-14 DIAGNOSIS — D2272 Melanocytic nevi of left lower limb, including hip: Secondary | ICD-10-CM | POA: Diagnosis not present

## 2021-11-14 DIAGNOSIS — D225 Melanocytic nevi of trunk: Secondary | ICD-10-CM | POA: Diagnosis not present

## 2021-12-15 ENCOUNTER — Encounter: Payer: Self-pay | Admitting: *Deleted

## 2021-12-21 DIAGNOSIS — U071 COVID-19: Secondary | ICD-10-CM | POA: Diagnosis not present

## 2021-12-21 DIAGNOSIS — I10 Essential (primary) hypertension: Secondary | ICD-10-CM | POA: Diagnosis not present

## 2021-12-27 ENCOUNTER — Other Ambulatory Visit: Payer: Self-pay | Admitting: Cardiovascular Disease

## 2022-01-03 NOTE — Progress Notes (Unsigned)
CARDIOLOGY OFFICE NOTE  Date:  01/03/2022    Will Bonnet Date of Birth: [birth year] Medical Record #[id number]  PCP:  Sharilyn Sites, MD  Cardiologist:  Johnsie Cancel    No chief complaint on file.   History of Present Illness:  61 y.o.  CAD with DES to proximal LAD in 2011 Repeat cath 09/03/17 ISR and new stent to mid LAD. EF normal   CRF;s include HTN, HLD.  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  Has first grand child son with baby in [place]   ***  Past Medical History:  Diagnosis Date   Anxiety    CAD (coronary artery disease)    11/18 cutting balloon to pLAD ISR, with DESx1 to mLAD, normal EF   Displaced fracture of proximal phalanx of right middle finger, subsequent encounter for fracture with malunion    HLD (hyperlipidemia)    HTN (hypertension)    Pneumonia 1997   S/P gallbladder OR   Sleep apnea    OSA uses CPAP nightly    Past Surgical History:  Procedure Laterality Date   BILE DUCT [place] WITH STENT PLACEMENT  2010; 09/03/2017   [place];    CORONARY STENT INTERVENTION N/A 09/03/2017   Procedure: CORONARY STENT INTERVENTION;  Surgeon: Jettie Booze, MD;  Location: [facility];  Service: Cardiovascular;  Laterality: N/A;   FLEXIBLE SIGMOIDOSCOPY     at age of 3 in [place]   [place]   with stent, RMR   LEFT HEART CATH AND CORONARY ANGIOGRAPHY N/A 09/03/2017   Procedure: LEFT HEART CATH AND CORONARY ANGIOGRAPHY;   Surgeon: Jettie Booze, MD;  Location: [facility];  Service: Cardiovascular;  Laterality: N/A;   OPEN REDUCTION INTERNAL FIXATION (ORIF) FINGER WITH RADIAL BONE GRAFT Right 01/08/2019   Procedure: OPEN REDUCTION INTERNAL FIXATION (ORIF) FINGER RIGHT;  Surgeon: Daryll Brod, MD;  Location: [facility];  Service: Orthopedics;  Laterality: Right;  AXILLARY BLOCK     Medications: No outpatient medications have been marked as taking for the 01/17/22 encounter (Appointment) with Josue Hector, MD.     Allergies: No Known Allergies  Social History: The patient  reports that he has quit smoking. His smoking use included cigarettes. He has a 13.50 pack-year smoking history. He has never used smokeless tobacco. He reports current alcohol use of about 21.0 standard drinks per week. He reports that he does not use drugs.   Family History: The patient's family history includes Cancer in his father; Diabetes in his mother; Heart attack in his paternal grandmother and paternal uncle; Hyperlipidemia in his brother, brother, and sister.   Review of Systems: Please see the history of present illness.   Otherwise, the review of systems is positive for none.   All other systems are reviewed and negative.   Physical Exam: VS:  There were no vitals taken for this visit. Marland Kitchen  BMI There is no height or weight  on file to calculate BMI.  Wt Readings from Last 3 Encounters:  12/22/20 194 lb (88 kg)  12/09/19 191 lb 9.6 oz (86.9 kg)  01/08/19 191 lb 5.8 oz (86.8 kg)   Affect appropriate Healthy:  appears stated age HEENT: normal Neck supple with no adenopathy JVP normal no bruits no thyromegaly Lungs clear with no wheezing and good diaphragmatic motion Heart:  S1/S2 no murmur, no rub, gallop or click PMI normal Abdomen: benighn, BS positve, no tenderness, no AAA no bruit.  No HSM or HJR Distal pulses intact with no bruits No edema Neuro non-focal Skin warm and dry No  muscular weakness    Current Outpatient Medications:    ALPRAZolam (XANAX) 0.5 MG tablet, Take 0.25-0.5 mg daily as needed by mouth for anxiety or sleep. , Disp: , Rfl:    aspirin 81 MG tablet, Take 81 mg by mouth daily., Disp: , Rfl:    Cholecalciferol (VITAMIN D) 2000 UNITS CAPS, Take 2,000 Units by mouth daily., Disp: , Rfl:    clopidogrel (PLAVIX) 75 MG tablet, TAKE (1) TABLET BY MOUTH ONCE DAILY., Disp: 30 tablet, Rfl: 0   Coenzyme Q10 (COQ10) 200 MG CAPS, Take 200 mg daily by mouth., Disp: , Rfl:    fenofibrate 160 MG tablet, Take 1 tablet (160 mg total) by mouth daily., Disp: 90 tablet, Rfl: 3   Icosapent Ethyl (VASCEPA PO), Take 1 g by mouth 4 (four) times daily., Disp: , Rfl:    losartan (COZAAR) 50 MG tablet, TAKE ONE TABLET BY MOUTH ONCE DAILY., Disp: 30 tablet, Rfl: 0   metoprolol tartrate (LOPRESSOR) 25 MG tablet, TAKE (1) TABLET BY MOUTH TWICE DAILY., Disp: 60 tablet, Rfl: 0   Multiple Vitamin (MULTIVITAMIN) tablet, Take 1 tablet by mouth daily., Disp: , Rfl:    nitroGLYCERIN (NITROSTAT) 0.4 MG SL tablet, PLACE 1 TAB UNDER TONGUE EVERY 5 MIN IF NEEDED FOR CHEST PAIN. MAY USE 3 TIMES.NO RELIEF CALL 911., Disp: 25 tablet, Rfl: 6   rosuvastatin (CRESTOR) 20 MG tablet, Take 1 tablet (20 mg total) by mouth daily., Disp: 90 tablet, Rfl: 3   vitamin E 400 UNIT capsule, Take 400 Units daily by mouth., Disp: , Rfl:   LABORATORY DATA:  EKG:  10/28/18  SR rate 70 PVC otherwise normal  12/08/18 SR rate 63 normal  01/03/2022 SR rate 65 PVC otherwise normal    Lab Results  Component Value Date   WBC 7.0 09/04/2017   HGB 13.0 09/04/2017   HCT 38.9 (L) 09/04/2017   PLT 184 09/04/2017   GLUCOSE 117 (H) 09/04/2017   NA 137 09/04/2017   K 3.7 09/04/2017   CL 106 09/04/2017   CREATININE 1.15 09/04/2017   BUN 11 09/04/2017   CO2 25 09/04/2017   TSH 1.481 Test methodology is 3rd generation TSH 03/01/2010   INR 1.0 08/30/2017    Other Studies Reviewed Today:  CORONARY STENT  INTERVENTION 08/2017  LEFT HEART CATH AND CORONARY ANGIOGRAPHY  Conclusion     Prox LAD-1 lesion is 70% stenosed. FFR of LAD 0.77. Previously placed Prox LAD-2 stent has an area of proximal stent restenosis, 70%, treated with a 3.5 x 10 Wolverine. Post intervention, there is a 10% residual stenosis. Mid LAD lesion is 75% stenosed. This was treated with a 3.0 x 16 Synergy drug-eluting stent, overlapping the distal edge of the previously placed stent from 2011. A stent was successfully placed. Post intervention, there is a 0% residual stenosis. The left ventricular systolic function is normal. LV  end diastolic pressure is normal. The left ventricular ejection fraction is 55-65% by visual estimate. There is no aortic valve stenosis. Unable to navigate right radial due to severe vasospasm.   Continue dual antiplatelet therapy for 12 months.  Brilinta was used since IV Cangrelor was used during the procedure.  If there is an issue with Brilinta such as cost, could switch to clopidogrel after 30 days.   Continue aggressive secondary prevention.      Assessment/Plan:  1.  CAD - ISR proximal and mid LAD 08/2017 no angina continue ASA/Plavix    2. HTN - Well controlled.  Continue current medications and low sodium Dash type diet.    3. HLD - on statin labs with primary   4. Anxiety/panic disorder - PRN xanax   F/U in a year   Jenkins Rouge

## 2022-01-17 ENCOUNTER — Ambulatory Visit: Payer: BC Managed Care – PPO | Admitting: Cardiovascular Disease

## 2022-01-17 ENCOUNTER — Other Ambulatory Visit: Payer: Self-pay

## 2022-01-17 ENCOUNTER — Encounter: Payer: Self-pay | Admitting: Cardiovascular Disease

## 2022-01-17 VITALS — BP 138/86 | HR 74 | Ht 72.0 in | Wt 192.0 lb

## 2022-01-17 DIAGNOSIS — I2510 Atherosclerotic heart disease of native coronary artery without angina pectoris: Secondary | ICD-10-CM | POA: Diagnosis not present

## 2022-01-17 DIAGNOSIS — E782 Mixed hyperlipidemia: Secondary | ICD-10-CM

## 2022-01-17 DIAGNOSIS — I10 Essential (primary) hypertension: Secondary | ICD-10-CM

## 2022-01-17 MED ORDER — ICOSAPENT ETHYL 1 G PO CAPS: 2.0000 g | ORAL_CAPSULE | Freq: Two times a day (BID) | ORAL | 11 refills | Status: AC

## 2022-01-17 MED ORDER — ROSUVASTATIN CALCIUM 20 MG PO TABS: 20.0000 mg | ORAL_TABLET | Freq: Every day | ORAL | 3 refills | Status: AC

## 2022-01-17 MED ORDER — LOSARTAN POTASSIUM 50 MG PO TABS: 50.0000 mg | ORAL_TABLET | Freq: Every day | ORAL | 3 refills | Status: DC

## 2022-01-17 MED ORDER — METOPROLOL TARTRATE 25 MG PO TABS
ORAL_TABLET | ORAL | 3 refills | Status: DC
Start: 2022-01-17 — End: 2023-01-23

## 2022-01-17 MED ORDER — FENOFIBRATE 160 MG PO TABS: 160.0000 mg | ORAL_TABLET | Freq: Every day | ORAL | 3 refills | Status: AC

## 2022-01-17 NOTE — Patient Instructions (Signed)
Medication Instructions:  °Your physician recommends that you continue on your current medications as directed. Please refer to the Current Medication list given to you today. ° °*If you need a refill on your cardiac medications before your next appointment, please call your pharmacy* ° °Lab Work: °If you have labs (blood work) drawn today and your tests are completely normal, you will receive your results only by: °MyChart Message (if you have MyChart) OR °A paper copy in the mail °If you have any lab test that is abnormal or we need to change your treatment, we will call you to review the results. ° °Testing/Procedures: °None ordered today. ° °Follow-Up: °At [facility], you and your health needs are our priority.  As part of our continuing mission to provide you with exceptional heart care, we have created designated Provider Care Teams.  These Care Teams include your primary Cardiologist (physician) and Advanced Practice Providers (APPs -  Physician Assistants and Nurse Practitioners) who all work together to provide you with the care you need, when you need it. ° °We recommend signing up for the patient portal called "MyChart".  Sign up information is provided on this After Visit Summary.  MyChart is used to connect with patients for Virtual Visits (Telemedicine).  Patients are able to view lab/test results, encounter notes, upcoming appointments, etc.  Non-urgent messages can be sent to your provider as well.   °To learn more about what you can do with MyChart, go to [email or web address]://[email or web address].   ° °Your next appointment:   °12 month(s) ° °The format for your next appointment:   °In Person ° °Provider:   °Peter Nishan, MD { ° °

## 2022-01-26 ENCOUNTER — Other Ambulatory Visit: Payer: Self-pay | Admitting: Cardiovascular Disease

## 2022-02-08 DIAGNOSIS — L570 Actinic keratosis: Secondary | ICD-10-CM | POA: Diagnosis not present

## 2022-02-15 ENCOUNTER — Ambulatory Visit (INDEPENDENT_AMBULATORY_CARE_PROVIDER_SITE_OTHER): Payer: Self-pay | Admitting: *Deleted

## 2022-02-15 VITALS — Ht 72.0 in | Wt 185.0 lb

## 2022-02-15 DIAGNOSIS — Z1211 Encounter for screening for malignant neoplasm of colon: Secondary | ICD-10-CM

## 2022-02-15 NOTE — Progress Notes (Signed)
Gastroenterology Pre-Procedure Review ? ?Request Date: 02/15/2022 ?Requesting Physician: 10 year recall, Last TCS 11/08/2011 done by Dr. Gala Romney, internal hemorrhoids, normal colon ? ?PATIENT REVIEW QUESTIONS: The patient responded to the following health history questions as indicated:   ? ?1. Diabetes Melitis: no ?2. Joint replacements in the past 12 months: no ?3. Major health problems in the past 3 months: no ?4. Has an artificial valve or MVP: no ?5. Has a defibrillator: no ?6. Has been advised in past to take antibiotics in advance of a procedure like teeth cleaning: no ?7. Family history of colon cancer: no ?8. Alcohol Use: yes, 12 beers a week ?9. Illicit drug Use: no ?10. History of sleep apnea: yes, mild case but doesn't use CPAP ?11. History of coronary artery or other vascular stents placed within the last 12 months: no ?12. History of any prior anesthesia complications: no ?13. Body mass index is 25.09 kg/m?. ?   ?MEDICATIONS & ALLERGIES:    ?Patient reports the following regarding taking any blood thinners:   ?Plavix? Yes, 75 mg daily ?Aspirin? Yes, 81 mg daily ?Coumadin? no ?Brilinta? no ?Xarelto? no ?Eliquis? no ?Pradaxa? no ?Savaysa? no ?Effient? no ? ?Patient confirms/reports the following medications:  ?Current Outpatient Medications  ?Medication Sig Dispense Refill  ? ALPRAZolam (XANAX) 0.5 MG tablet Take 0.25-0.5 mg daily as needed by mouth for anxiety or sleep.     ? aspirin 81 MG tablet Take 81 mg by mouth daily.    ? Cholecalciferol (VITAMIN D) 2000 UNITS CAPS Take 5,000 Units by mouth daily.    ? clopidogrel (PLAVIX) 75 MG tablet TAKE (1) TABLET BY MOUTH ONCE DAILY. 90 tablet 3  ? Coenzyme Q10 (COQ10) 200 MG CAPS Take 200 mg daily by mouth.    ? fenofibrate 160 MG tablet Take 1 tablet (160 mg total) by mouth daily. 90 tablet 3  ? icosapent Ethyl (VASCEPA) 1 g capsule Take 2 capsules (2 g total) by mouth 2 (two) times daily. 120 capsule 11  ? losartan (COZAAR) 50 MG tablet Take 1 tablet (50 mg  total) by mouth daily. 90 tablet 3  ? metoprolol tartrate (LOPRESSOR) 25 MG tablet TAKE (1) TABLET BY MOUTH TWICE DAILY. 180 tablet 3  ? Multiple Vitamin (MULTIVITAMIN) tablet Take 1 tablet by mouth daily.    ? nitroGLYCERIN (NITROSTAT) 0.4 MG SL tablet PLACE 1 TAB UNDER TONGUE EVERY 5 MIN IF NEEDED FOR CHEST PAIN. MAY USE 3 TIMES.NO RELIEF CALL 911. 25 tablet 6  ? rosuvastatin (CRESTOR) 20 MG tablet Take 1 tablet (20 mg total) by mouth daily. 90 tablet 3  ? vitamin E 400 UNIT capsule Take 400 Units by mouth daily.    ? ?No current facility-administered medications for this visit.  ? ? ?Patient confirms/reports the following allergies:  ?No Known Allergies ? ?No orders of the defined types were placed in this encounter. ? ? ?AUTHORIZATION INFORMATION ?Primary Insurance: [place],  [place] #: [id number],  Group #: [id number] ?Pre-Cert / Josem Kaufmann required: No, not required ? ?SCHEDULE INFORMATION: ?Procedure has been scheduled as follows:  ?Date: , Time:   ?Location: [facility] with Dr. Gala Romney ? ?This Gastroenterology Pre-Precedure Review Form is being routed to the following provider(s): Neil Crouch, PA-C ?  ?

## 2022-02-23 NOTE — Progress Notes (Addendum)
Please schedule him for office visit due to cardiac history. OK for virtual visit.  ?

## 2022-02-26 NOTE — Progress Notes (Signed)
Spoke to pt.  Informed him that ov needed to arrange colonoscopy.  Pt scheduled ov virtually for 03/01/2022 at 9:00 with Venetia Night, NP. ?

## 2022-02-28 NOTE — Progress Notes (Addendum)
? ?Referring Provider:  ?Primary Care Physician:  Sharilyn Sites, MD  ?Primary GI:  ? ?Patient Location: Home ?  ?Provider Location: [place] office ?  ?Reason for Visit: schedule screening colonoscopy ?  ?Persons present on the virtual encounter, with roles: Herbie Lege-patient, Venetia Night, NP ?  ?Total time (minutes) spent on medical discussion: 12 minutes ? ?Virtual Visit via Telephone Note ?Visit is conducted virtually and was requested by patient.  ? ?I connected with Britt Boozer on 03/01/22 at  9:00 AM EDT by video and verified that I am speaking with the correct person using two identifiers. ?  ?I discussed the limitations, risks, security and privacy concerns of performing an evaluation and management service by video and the availability of in person appointments. I also discussed with the patient that there may be a patient responsible charge related to this service. The patient expressed understanding and agreed to proceed. ? ?Chief Complaint  ?Patient presents with  ? Colonoscopy  ? ? ? ?History of Present Illness: ? ?Last colonoscopy 11/08/31 - transverse diverticula, normal colon otherwise, internal hemorrhoids ? ?Patient has no complaints today.  Denies any reflux, dysphagia, melena, hematochezia, nausea, vomiting, abdominal pain, unintentional weight loss, early satiety.  He denies any changes in bowel habits.  Denies any shortness of breath or chest pains.  ? ?He sees a Designer, jewellery at his job every 3 months, and will have lab work done.  He sees his PCP and his cardiologist annually.  No recent lab work in Dentist.  Patient reports that all of his lab work has been normal.  He has been on Plavix for about 3 years, cardiology stated for him to stay on it as long as it was not causing him any issues.  No recent bleeding episodes. ? ?He is a former smoker, currently not smoking.  He does drink alcohol, about 1-2 beers daily. ? ? ?Past Medical History:  ?Diagnosis Date  ? Anxiety   ? CAD (coronary  artery disease)   ? 11/18 cutting balloon to pLAD ISR, with DESx1 to mLAD, normal EF  ? Displaced fracture of proximal phalanx of right middle finger, subsequent encounter for fracture with malunion   ? HLD (hyperlipidemia)   ? HTN (hypertension)   ? Pneumonia 1997  ? S/P gallbladder OR  ? Sleep apnea   ? OSA uses CPAP nightly  ? ? ? ?Past Surgical History:  ?Procedure Laterality Date  ? BILE DUCT STENT PLACEMENT  1997  ? CORONARY ANGIOPLASTY WITH STENT PLACEMENT  2010; 09/03/2017  ? [facility];   ? CORONARY STENT INTERVENTION N/A 09/03/2017  ? Procedure: CORONARY STENT INTERVENTION;  Surgeon: Jettie Booze, MD;  Location: [place] [facility];  Service: Cardiovascular;  Laterality: N/A;  ? FLEXIBLE SIGMOIDOSCOPY    ? at age of 49 in [place]  ? [place]  ? with stent, RMR  ? LEFT HEART CATH AND CORONARY ANGIOGRAPHY N/A 09/03/2017  ? Procedure: LEFT HEART CATH AND CORONARY ANGIOGRAPHY;  Surgeon: Jettie Booze, MD;  Location: [place] [facility];  Service: Cardiovascular;  Laterality: N/A;  ? OPEN REDUCTION INTERNAL FIXATION (ORIF) FINGER WITH RADIAL BONE GRAFT Right 01/08/2019  ? Procedure: OPEN REDUCTION INTERNAL FIXATION (ORIF) FINGER RIGHT;  Surgeon: Daryll Brod, MD;  Location: [place];  Service: Orthopedics;  Laterality: Right;  AXILLARY BLOCK  ? ? ? ?Current Meds  ?Medication Sig  ? ALPRAZolam (XANAX) 0.5 MG tablet Take 0.25-0.5 mg daily as needed by mouth for  anxiety or sleep.   ? aspirin 81 MG tablet Take 81 mg by mouth daily.  ? Cholecalciferol (VITAMIN D) 2000 UNITS CAPS Take 5,000 Units by mouth daily.  ? clopidogrel (PLAVIX) 75 MG tablet TAKE (1) TABLET BY MOUTH ONCE DAILY.  ? Coenzyme Q10 (COQ10) 200 MG CAPS Take 200 mg daily by mouth.  ? fenofibrate 160 MG tablet Take 1 tablet (160 mg total) by mouth daily.  ? icosapent Ethyl (VASCEPA) 1 g capsule Take 2 capsules (2 g total) by mouth 2 (two) times daily.  ? losartan (COZAAR) 50 MG tablet Take 1 tablet (50  mg total) by mouth daily.  ? metoprolol tartrate (LOPRESSOR) 25 MG tablet TAKE (1) TABLET BY MOUTH TWICE DAILY.  ? Multiple Vitamin (MULTIVITAMIN) tablet Take 1 tablet by mouth daily.  ? nitroGLYCERIN (NITROSTAT) 0.4 MG SL tablet PLACE 1 TAB UNDER TONGUE EVERY 5 MIN IF NEEDED FOR CHEST PAIN. MAY USE 3 TIMES.NO RELIEF CALL 911.  ? rosuvastatin (CRESTOR) 20 MG tablet Take 1 tablet (20 mg total) by mouth daily.  ? vitamin E 400 UNIT capsule Take 400 Units by mouth daily.  ?  ? ?Family History  ?Problem Relation Age of Onset  ? Cancer Father   ?     lung  ? Diabetes Mother   ? Hyperlipidemia Sister   ? Hyperlipidemia Brother   ? Hyperlipidemia Brother   ? Heart attack Paternal Grandmother   ? Heart attack Paternal Uncle   ? Colon cancer Neg Hx   ? Anesthesia problems Neg Hx   ? Malignant hyperthermia Neg Hx   ? Hypotension Neg Hx   ? Pseudochol deficiency Neg Hx   ? ? ?Social History  ? ?Socioeconomic History  ? Marital status: Married  ?  Spouse name: Not on file  ? Number of children: Not on file  ? Years of education: Not on file  ? Highest education level: Not on file  ?Occupational History  ? Occupation: Charity fundraiser  ?Tobacco Use  ? Smoking status: Former  ?  Packs/day: 0.75  ?  Years: 18.00  ?  Pack years: 13.50  ?  Types: Cigarettes  ? Smokeless tobacco: Never  ? Tobacco comments:  ?  "stopped smoking ~ 2005"  ?Vaping Use  ? Vaping Use: Never used  ?Substance and Sexual Activity  ? Alcohol use: Yes  ?  Alcohol/week: 21.0 standard drinks  ?  Types: 21 Cans of beer per week  ?  Comment: social  ? Drug use: No  ? Sexual activity: Yes  ?Other Topics Concern  ? Not on file  ?Social History Narrative  ? Not on file  ? ?Social Determinants of Health  ? ?Financial Resource Strain: Not on file  ?Food Insecurity: Not on file  ?Transportation Needs: Not on file  ?Physical Activity: Not on file  ?Stress: Not on file  ?Social Connections: Not on file  ? ? ? ? Review of Systems: ?Gen: Denies fever, chills, anorexia. Denies  fatigue, weakness, weight loss.  ?CV: Denies chest pain, palpitations, syncope, peripheral edema, and claudication. ?Resp: Denies dyspnea at rest, cough, wheezing, coughing up blood, and pleurisy. ?GI: see HPI ?Derm: Denies rash, itching, dry skin ?Psych: Denies depression, anxiety, memory loss, confusion. No homicidal or suicidal ideation.  ?Heme: Denies bruising, bleeding, and enlarged lymph nodes. ? ?Observations/Objective: ?No distress. Alert and oriented. Pleasant. Well nourished. Normal mood and affect.  Patient has glasses.  Unable to perform complete physical exam due to virtual encounter. ? ?Assessment and  Plan: ?61 year old male patient with history of anxiety, HTN, OSA (uses CPAP), and CAD status post stents in 2018 currently on 81 mg aspirin and Plavix, and cholecystectomy in 1997 who presents today for evaluation prior to scheduling screening colonoscopy.  His last colonoscopy was in January 2013 with diverticulosis in the transverse colon and internal hemorrhoids, exam otherwise normal.  Patient not complaining of any issues.  No alarm symptoms present.  No recent change in bowel habits, no shortness of breath, or chest pain.  Follows regularly with his cardiologist. ? ?Proceed with colonoscopy by Dr. Gala Romney in near future: the risks, benefits, and alternatives have been discussed with the patient in detail. The patient states understanding and desires to proceed. ASA 3 ?No need to hold Plavix as Dr. Gala Romney does not require it to be held. ? ? ? ?Follow Up Instructions: ?We will mail prep instructions and AVS. We will contact patient to schedule procedure. ? ? ?I discussed the assessment and treatment plan with the patient. The patient was provided an opportunity to ask questions and all were answered. The patient agreed with the plan and demonstrated an understanding of the instructions. ?  ?The patient was advised to call back or seek an in-person evaluation if the symptoms worsen or if the condition  fails to improve as anticipated. ? ? ? ?Venetia Night, MSN, FNP-BC, AGACNP-BC ?[facility] ? ?

## 2022-03-01 ENCOUNTER — Encounter: Payer: Self-pay | Admitting: Gastroenterology

## 2022-03-01 ENCOUNTER — Telehealth (INDEPENDENT_AMBULATORY_CARE_PROVIDER_SITE_OTHER): Payer: BC Managed Care – PPO | Admitting: Gastroenterology

## 2022-03-01 ENCOUNTER — Telehealth: Payer: Self-pay | Admitting: *Deleted

## 2022-03-01 VITALS — Ht 72.0 in | Wt 185.0 lb

## 2022-03-01 DIAGNOSIS — Z1211 Encounter for screening for malignant neoplasm of colon: Secondary | ICD-10-CM | POA: Diagnosis not present

## 2022-03-01 NOTE — Patient Instructions (Signed)
We are scheduling you for colonoscopy in the near future with Dr. Gala Romney. ? ?Please do not take Plavix for 5 days prior to procedure, we will reach out to heart care for clearance to hold this. ? ?Our office will contact you to schedule the procedure once clearance is obtained. ? ?It was a pleasure to see you today. I want to create trusting relationships with patients. If you receive a survey regarding your visit,  I greatly appreciate you taking time to fill this out on paper or through your MyChart. I value your feedback. ? ?Venetia Night, MSN, FNP-BC, AGACNP-BC ?[facility] ? ? ?

## 2022-03-01 NOTE — Telephone Encounter (Signed)
Pt consented to a virtual visit. 

## 2022-03-01 NOTE — Telephone Encounter (Signed)
Jeff Prince, you are scheduled for a virtual visit with your provider today.  Just as we do with appointments in the office, we must obtain your consent to participate.  Your consent Jeff be active for this visit and any virtual visit you may have with one of our providers in the next 365 days.  If you have a MyChart account, I can also send a copy of this consent to you electronically.  All virtual visits are billed to your insurance company just like a traditional visit in the office.  As this is a virtual visit, video technology does not allow for your provider to perform a traditional examination.  This may limit your provider's ability to fully assess your condition.  If your provider identifies any concerns that need to be evaluated in person or the need to arrange testing such as labs, EKG, etc, we Jeff make arrangements to do so.  Although advances in technology are sophisticated, we cannot ensure that it Jeff always work on either your end or our end.  If the connection with a video visit is poor, we may have to switch to a telephone visit.  With either a video or telephone visit, we are not always able to ensure that we have a secure connection.   I need to obtain your verbal consent now.   Are you willing to proceed with your visit today?  ?

## 2022-03-06 ENCOUNTER — Encounter: Payer: Self-pay | Admitting: *Deleted

## 2022-03-06 ENCOUNTER — Telehealth: Payer: Self-pay | Admitting: *Deleted

## 2022-03-06 MED ORDER — PEG 3350-KCL-NA BICARB-NACL 420 G PO SOLR: ORAL | 0 refills | Status: DC

## 2022-03-06 NOTE — Telephone Encounter (Signed)
Called pt. He has been scheduled for TCS with Dr. Gala Romney, asa 3 on 6/1 at 1:30pm. Aware will mail prep instructions and pre-op appt. Will send rx for prep. ?

## 2022-03-07 DIAGNOSIS — E785 Hyperlipidemia, unspecified: Secondary | ICD-10-CM | POA: Diagnosis not present

## 2022-03-07 DIAGNOSIS — Z1331 Encounter for screening for depression: Secondary | ICD-10-CM | POA: Diagnosis not present

## 2022-03-07 DIAGNOSIS — I10 Essential (primary) hypertension: Secondary | ICD-10-CM | POA: Diagnosis not present

## 2022-03-07 DIAGNOSIS — I2510 Atherosclerotic heart disease of native coronary artery without angina pectoris: Secondary | ICD-10-CM | POA: Diagnosis not present

## 2022-03-07 DIAGNOSIS — R7309 Other abnormal glucose: Secondary | ICD-10-CM | POA: Diagnosis not present

## 2022-03-07 DIAGNOSIS — Z6825 Body mass index (BMI) 25.0-25.9, adult: Secondary | ICD-10-CM | POA: Diagnosis not present

## 2022-03-07 DIAGNOSIS — Z0000 Encounter for general adult medical examination without abnormal findings: Secondary | ICD-10-CM | POA: Diagnosis not present

## 2022-03-07 DIAGNOSIS — R945 Abnormal results of liver function studies: Secondary | ICD-10-CM | POA: Diagnosis not present

## 2022-03-07 DIAGNOSIS — E663 Overweight: Secondary | ICD-10-CM | POA: Diagnosis not present

## 2022-03-15 DIAGNOSIS — Z0000 Encounter for general adult medical examination without abnormal findings: Secondary | ICD-10-CM | POA: Diagnosis not present

## 2022-03-15 DIAGNOSIS — I2510 Atherosclerotic heart disease of native coronary artery without angina pectoris: Secondary | ICD-10-CM | POA: Diagnosis not present

## 2022-03-15 DIAGNOSIS — E785 Hyperlipidemia, unspecified: Secondary | ICD-10-CM | POA: Diagnosis not present

## 2022-03-15 DIAGNOSIS — I10 Essential (primary) hypertension: Secondary | ICD-10-CM | POA: Diagnosis not present

## 2022-03-22 NOTE — Patient Instructions (Signed)
   Your procedure is scheduled on: 03/29/2022  Report to [facility] Entrance at    11:45 AM.  Call this number if you have problems the morning of surgery: [phone]   Remember:              Follow Directions on the letter you received from Your Physician's office regarding the Bowel Prep              No Smoking the day of Procedure :   Take these medicines the morning of surgery with A SIP OF WATER: xanax   Do not wear jewelry, make-up or nail polish.    Do not bring valuables to the hospital.  Contacts, dentures or bridgework may not be worn into surgery.  .   Patients discharged the day of surgery will not be allowed to drive home.     Colonoscopy, Adult, Care After This sheet gives you information about how to care for yourself after your procedure. Your health care provider may also give you more specific instructions. If you have problems or questions, contact your health care provider. What can I expect after the procedure? After the procedure, it is common to have: A small amount of blood in your stool for 24 hours after the procedure. Some gas. Mild abdominal cramping or bloating.  Follow these instructions at home: General instructions  For the first 24 hours after the procedure: Do not drive or use machinery. Do not sign important documents. Do not drink alcohol. Do your regular daily activities at a slower pace than normal. Eat soft, easy-to-digest foods. Rest often. Take over-the-counter or prescription medicines only as told by your health care provider. It is up to you to get the results of your procedure. Ask your health care provider, or the department performing the procedure, when your results will be ready. Relieving cramping and bloating Try walking around when you have cramps or feel bloated. Apply heat to your abdomen as told by your health care provider. Use a heat source that your health care provider recommends, such as a moist heat pack or a  heating pad. Place a towel between your skin and the heat source. Leave the heat on for 20-30 minutes. Remove the heat if your skin turns bright red. This is especially important if you are unable to feel pain, heat, or cold. You may have a greater risk of getting burned. Eating and drinking Drink enough fluid to keep your urine clear or pale yellow. Resume your normal diet as instructed by your health care provider. Avoid heavy or fried foods that are hard to digest. Avoid drinking alcohol for as long as instructed by your health care provider. Contact a health care provider if: You have blood in your stool 2-3 days after the procedure. Get help right away if: You have more than a small spotting of blood in your stool. You pass large blood clots in your stool. Your abdomen is swollen. You have nausea or vomiting. You have a fever. You have increasing abdominal pain that is not relieved with medicine. This information is not intended to replace advice given to you by your health care provider. Make sure you discuss any questions you have with your health care provider. Document Released: 05/29/2004 Document Revised: 07/09/2016 Document Reviewed: 12/27/2015 Elsevier Interactive Patient Education  [employer].

## 2022-03-23 ENCOUNTER — Encounter (HOSPITAL_COMMUNITY)
Admission: RE | Admit: 2022-03-23 | Discharge: 2022-03-23 | Disposition: A | Discharge status: Home / Self Care | Payer: BC Managed Care – PPO | Source: Ambulatory Visit | Attending: Internal Medicine | Admitting: Internal Medicine

## 2022-03-23 ENCOUNTER — Encounter (HOSPITAL_COMMUNITY): Payer: Self-pay

## 2022-03-29 ENCOUNTER — Encounter (HOSPITAL_COMMUNITY): Payer: Self-pay | Admitting: Internal Medicine

## 2022-03-29 ENCOUNTER — Ambulatory Visit (HOSPITAL_COMMUNITY): Payer: BC Managed Care – PPO | Admitting: Anesthesiology

## 2022-03-29 ENCOUNTER — Ambulatory Visit (HOSPITAL_COMMUNITY)
Admission: RE | Admit: 2022-03-29 | Discharge: 2022-03-29 | Disposition: A | Discharge status: Home / Self Care | Payer: BC Managed Care – PPO | Attending: Internal Medicine | Admitting: Internal Medicine

## 2022-03-29 ENCOUNTER — Encounter (HOSPITAL_COMMUNITY)
Admission: RE | Disposition: A | Discharge status: Home / Self Care | Payer: Self-pay | Source: Home / Self Care | Attending: Internal Medicine

## 2022-03-29 DIAGNOSIS — I10 Essential (primary) hypertension: Secondary | ICD-10-CM | POA: Diagnosis not present

## 2022-03-29 DIAGNOSIS — K5730 Diverticulosis of large intestine without perforation or abscess without bleeding: Secondary | ICD-10-CM | POA: Insufficient documentation

## 2022-03-29 DIAGNOSIS — D123 Benign neoplasm of transverse colon: Secondary | ICD-10-CM

## 2022-03-29 DIAGNOSIS — Z1211 Encounter for screening for malignant neoplasm of colon: Secondary | ICD-10-CM

## 2022-03-29 DIAGNOSIS — I2510 Atherosclerotic heart disease of native coronary artery without angina pectoris: Secondary | ICD-10-CM | POA: Diagnosis not present

## 2022-03-29 DIAGNOSIS — F419 Anxiety disorder, unspecified: Secondary | ICD-10-CM | POA: Diagnosis not present

## 2022-03-29 DIAGNOSIS — K635 Polyp of colon: Secondary | ICD-10-CM | POA: Diagnosis not present

## 2022-03-29 DIAGNOSIS — Z87891 Personal history of nicotine dependence: Secondary | ICD-10-CM | POA: Insufficient documentation

## 2022-03-29 DIAGNOSIS — K640 First degree hemorrhoids: Secondary | ICD-10-CM | POA: Insufficient documentation

## 2022-03-29 HISTORY — PX: POLYPECTOMY: SHX149

## 2022-03-29 HISTORY — PX: COLONOSCOPY WITH PROPOFOL: SHX5780

## 2022-03-29 SURGERY — COLONOSCOPY WITH PROPOFOL
Anesthesia: General

## 2022-03-29 MED ORDER — LACTATED RINGERS IV SOLN: INTRAVENOUS | Status: DC

## 2022-03-29 MED ORDER — STERILE WATER FOR IRRIGATION IR SOLN
Status: DC | PRN
  Administered 2022-03-29: 100 mL

## 2022-03-29 MED ORDER — PROPOFOL 10 MG/ML IV BOLUS
INTRAVENOUS | Status: DC | PRN
  Administered 2022-03-29: 80 mg via INTRAVENOUS
  Administered 2022-03-29 (×4): 50 mg via INTRAVENOUS

## 2022-03-29 MED ORDER — LIDOCAINE HCL 1 % IJ SOLN
INTRAMUSCULAR | Status: DC | PRN
  Administered 2022-03-29: 50 mg via INTRADERMAL

## 2022-03-29 NOTE — Op Note (Signed)
[facility] Patient Name: Jeff Prince Procedure Date: 03/29/2022 12:10 PM MRN: [id number] Date of Birth: [birth year] Attending MD: Norvel Richards , MD CSN: [id number] Age: 61 Admit Type: Outpatient Procedure:                Colonoscopy Indications:              Screening for colorectal malignant neoplasm Providers:                Norvel Richards, MD, Crystal Page, Raphael Gibney, Technician Referring MD:              Medicines:                Propofol per Anesthesia Complications:            No immediate complications. Estimated Blood Loss:     Estimated blood loss was minimal. Procedure:                Pre-Anesthesia Assessment:                           - Prior to the procedure, a History and Physical                            was performed, and patient medications and                            allergies were reviewed. The patient's tolerance of                            previous anesthesia was also reviewed. The risks                            and benefits of the procedure and the sedation                            options and risks were discussed with the patient.                            All questions were answered, and informed consent                            was obtained. Prior Anticoagulants: The patient has                            taken no previous anticoagulant or antiplatelet                            agents. ASA Grade Assessment: III - A patient with                            severe systemic disease. After reviewing the risks  and benefits, the patient was deemed in                            satisfactory condition to undergo the procedure.                           After obtaining informed consent, the colonoscope                            was passed under direct vision. Throughout the                            procedure, the patient's blood pressure, pulse, and                             oxygen saturations were monitored continuously. The                            [id number] scope was introduced through the                            anus and advanced to the the cecum, identified by                            appendiceal orifice and ileocecal valve. The                            colonoscopy was performed without difficulty. The                            patient tolerated the procedure well. The quality                            of the bowel preparation was adequate. The entire                            colon was well visualized. Scope In: 1:05:14 PM Scope Out: 1:19:14 PM Scope Withdrawal Time: 0 hours 10 minutes 20 seconds  Total Procedure Duration: 0 hours 14 minutes 0 seconds  Findings:      The perianal and digital rectal examinations were normal.      Scattered small and large-mouthed diverticula were found in the entire       colon.      Two semi-pedunculated polyps were found in the splenic flexure. The       polyps were 5 to 7 mm in size. These polyps were removed with a cold       snare. Resection and retrieval were complete. Estimated blood loss was       minimal.      Non-bleeding internal hemorrhoids were found during retroflexion. The       hemorrhoids were moderate, medium-sized and Grade I (internal       hemorrhoids that do not prolapse).      The exam was otherwise without abnormality on direct and retroflexion       views. Impression:               -  Diverticulosis in the entire examined colon.                           - Two 5 to 7 mm polyps at the splenic flexure,                            removed with a cold snare. Resected and retrieved.                           - Non-bleeding internal hemorrhoids.                           - The examination was otherwise normal on direct                            and retroflexion views. Moderate Sedation:      Moderate (conscious) sedation was personally administered by an       anesthesia  professional. The following parameters were monitored: oxygen       saturation, heart rate, blood pressure, respiratory rate, EKG, adequacy       of pulmonary ventilation, and response to care. Recommendation:           - Patient has a contact number available for                            emergencies. The signs and symptoms of potential                            delayed complications were discussed with the                            patient. Return to normal activities tomorrow.                            Written discharge instructions were provided to the                            patient.                           - Advance diet as tolerated.                           - Continue present medications.                           - Repeat colonoscopy date to be determined after                            pending pathology results are reviewed for                            surveillance.                           - Return to GI office (date not  yet determined). Procedure Code(s):        --- Professional ---                           [phone], Colonoscopy, flexible; with removal of                            tumor(s), polyp(s), or other lesion(s) by snare                            technique Diagnosis Code(s):        --- Professional ---                           Z12.11, Encounter for screening for malignant                            neoplasm of colon                           K64.0, First degree hemorrhoids                           K63.5, Polyp of colon                           K57.30, Diverticulosis of large intestine without                            perforation or abscess without bleeding CPT copyright 2019 American Medical Association. All rights reserved. The codes documented in this report are preliminary and upon coder review may  be revised to meet current compliance requirements. Cristopher Estimable. Sparrow [place], MD Norvel Richards, MD 03/29/2022 1:28:21 PM This report has been signed  electronically. Number of Addenda: 0

## 2022-03-29 NOTE — Anesthesia Postprocedure Evaluation (Signed)
Anesthesia Post Note  Patient: Jeff Prince  Procedure(s) Performed: COLONOSCOPY WITH PROPOFOL POLYPECTOMY INTESTINAL  Patient location during evaluation: Short Stay Anesthesia Type: General Level of consciousness: awake and alert Pain management: pain level controlled Vital Signs Assessment: post-procedure vital signs reviewed and stable Respiratory status: spontaneous breathing Cardiovascular status: blood pressure returned to baseline and stable Postop Assessment: no apparent nausea or vomiting Anesthetic complications: no   No notable events documented.   Last Vitals:  Vitals:   03/29/22 1214 03/29/22 1323  BP: (!) 143/92 115/79  Pulse: 63 79  Resp: 18 17  Temp: 36.8 C 36.7 C  SpO2: 97% 96%    Last Pain:  Vitals:   03/29/22 1323  TempSrc: Oral  PainSc: 0-No pain                 Lonn Im

## 2022-03-29 NOTE — H&P (Signed)
$'@LOGO'W$ @   Primary Care Physician:  Sharilyn Sites, MD Primary Gastroenterologist:  Dr. Gala Romney  Pre-Procedure History & Physical: HPI:  Jeff Prince is a 61 y.o. male is here for a screening colonoscopy.  Negative colonoscopy 10 years ago (hemorrhoids and diverticulosis).  No bowel symptoms.  No family history of colon cancer. For average rescreening.  Past Medical History:  Diagnosis Date   Anxiety    CAD (coronary artery disease)    11/18 cutting balloon to pLAD ISR, with DESx1 to mLAD, normal EF   Displaced fracture of proximal phalanx of right middle finger, subsequent encounter for fracture with malunion    HLD (hyperlipidemia)    HTN (hypertension)    Pneumonia 1997   S/P gallbladder OR   Sleep apnea    OSA uses CPAP nightly    Past Surgical History:  Procedure Laterality Date   BILE DUCT STENT PLACEMENT  1997   CORONARY ANGIOPLASTY WITH STENT PLACEMENT  2010; 09/03/2017   [place];    CORONARY STENT INTERVENTION N/A 09/03/2017   Procedure: CORONARY STENT INTERVENTION;  Surgeon: Jettie Booze, MD;  Location: [facility];  Service: Cardiovascular;  Laterality: N/A;   FLEXIBLE SIGMOIDOSCOPY     at age of 60 in [place]   [place]   with stent, RMR   LEFT HEART CATH AND CORONARY ANGIOGRAPHY N/A 09/03/2017   Procedure: LEFT HEART CATH AND CORONARY ANGIOGRAPHY;  Surgeon: Jettie Booze, MD;  Location: [facility];  Service: Cardiovascular;  Laterality: N/A;   OPEN REDUCTION INTERNAL FIXATION (ORIF) FINGER WITH RADIAL BONE GRAFT Right 01/08/2019   Procedure: OPEN REDUCTION INTERNAL FIXATION (ORIF) FINGER RIGHT;  Surgeon: Daryll Brod, MD;  Location: [facility];  Service: Orthopedics;  Laterality: Right;  AXILLARY BLOCK    Prior to Admission medications   Medication Sig Start Date End Date Taking? Authorizing Provider  APPLE CIDER VINEGAR PO Take 1 tablet by mouth daily.   Yes [provider]  aspirin 81 MG  tablet Take 81 mg by mouth daily.   Yes [provider]  Cholecalciferol (VITAMIN D) 2000 UNITS CAPS Take 5,000 Units by mouth daily.   Yes [provider]  clopidogrel (PLAVIX) 75 MG tablet TAKE (1) TABLET BY MOUTH ONCE DAILY. 01/26/22  Yes Josue Hector, MD  Coenzyme Q10 (COQ10) 200 MG CAPS Take 200 mg daily by mouth.   Yes [provider]  fenofibrate 160 MG tablet Take 1 tablet (160 mg total) by mouth daily. 01/17/22  Yes Josue Hector, MD  icosapent Ethyl (VASCEPA) 1 g capsule Take 2 capsules (2 g total) by mouth 2 (two) times daily. 01/17/22  Yes Josue Hector, MD  losartan (COZAAR) 50 MG tablet Take 1 tablet (50 mg total) by mouth daily. 01/17/22  Yes Josue Hector, MD  metoprolol tartrate (LOPRESSOR) 25 MG tablet TAKE (1) TABLET BY MOUTH TWICE DAILY. 01/17/22  Yes Josue Hector, MD  nitroGLYCERIN (NITROSTAT) 0.4 MG SL tablet PLACE 1 TAB UNDER TONGUE EVERY 5 MIN IF NEEDED FOR CHEST PAIN. MAY USE 3 TIMES.NO RELIEF CALL 911. 12/11/19  Yes Josue Hector, MD  rosuvastatin (CRESTOR) 20 MG tablet Take 1 tablet (20 mg total) by mouth daily. 01/17/22  Yes Josue Hector, MD  vitamin C (ASCORBIC ACID) 250 MG tablet Take 250 mg by mouth daily. 02/08/22  Yes [provider]  vitamin E 400 UNIT capsule Take 400 Units by mouth daily.   Yes  [provider]  ALPRAZolam Duanne Moron) 0.5 MG tablet Take 0.25-0.5 mg daily as needed by mouth for anxiety or sleep.  06/15/11   [provider]  polyethylene glycol-electrolytes (NULYTELY) 420 g solution As directed 03/06/22   Daneil Dolin, MD  [facility] 625 MG tablet Take 1,250 mg by mouth 2 (two) times daily with a meal.  09/07/11 01/07/12  [provider]    Allergies as of 03/06/2022   (No Known Allergies)    Family History  Problem Relation Age of Onset   Diabetes Mother    Cancer Father        lung   Hyperlipidemia Sister    Hyperlipidemia Brother    Hyperlipidemia Brother    Heart attack  Paternal Grandmother    Heart attack Paternal Uncle    Colon cancer Neg Hx    Anesthesia problems Neg Hx    Malignant hyperthermia Neg Hx    Hypotension Neg Hx    Pseudochol deficiency Neg Hx    Colon polyps Neg Hx     Social History   Socioeconomic History   Marital status: Married    Spouse name: Not on file   Number of children: Not on file   Years of education: Not on file   Highest education level: Not on file  Occupational History   Occupation: Charity fundraiser  Tobacco Use   Smoking status: Former    Packs/day: 0.75    Years: 18.00    Pack years: 13.50    Types: Cigarettes   Smokeless tobacco: Never   Tobacco comments:    "stopped smoking ~ 2005"  Vaping Use   Vaping Use: Never used  Substance and Sexual Activity   Alcohol use: Yes    Alcohol/week: 7.0 - 10.0 standard drinks    Types: 7 - 10 Standard drinks or equivalent per week    Comment: 1-2 beers daily   Drug use: No   Sexual activity: Yes  Other Topics Concern   Not on file  Social History Narrative   Not on file   Social Determinants of Health   Financial Resource Strain: Not on file  Food Insecurity: Not on file  Transportation Needs: Not on file  Physical Activity: Not on file  Stress: Not on file  Social Connections: Not on file  Intimate Partner Violence: Not on file    Review of Systems: See HPI, otherwise negative ROS  Physical Exam: BP (!) 143/92   Pulse 63   Temp 98.2 F (36.8 C) (Oral)   Resp 18   SpO2 97%  General:   Alert,  Well-developed, well-nourished, pleasant and cooperative in NAD Head:  Normocephalic and atraumatic. Neck:  Supple; no masses or thyromegaly. Lungs:  Clear throughout to auscultation.   No wheezes, crackles, or rhonchi. No acute distress. Heart:  Regular rate and rhythm; no murmurs, clicks, rubs,  or gallops. Abdomen:  Soft, nontender and nondistended. No masses, hepatosplenomegaly or hernias noted. Normal bowel sounds, without guarding, and without rebound.     Impression/Plan: Jeff Prince is now here to undergo a screening colonoscopy.  Risks, benefits, limitations, imponderables and alternatives regarding colonoscopy have been reviewed with the patient. Questions have been answered. All parties agreeable.     Notice:  This dictation was prepared with Dragon dictation along with smaller phrase technology. Any transcriptional errors that result from this process are unintentional and may not be corrected upon review.

## 2022-03-29 NOTE — Anesthesia Preprocedure Evaluation (Signed)
Anesthesia Evaluation  Patient identified by MRN, date of birth, ID band Patient awake    Reviewed: Allergy & Precautions, H&P , NPO status , Patient's Chart, lab work & pertinent test results, reviewed documented beta blocker date and time   Airway Mallampati: II  TM Distance: >3 FB Neck ROM: full    Dental no notable dental hx.    Pulmonary sleep apnea , former smoker,    Pulmonary exam normal breath sounds clear to auscultation       Cardiovascular Exercise Tolerance: Good hypertension, + CAD   Rhythm:regular Rate:Normal     Neuro/Psych Anxiety negative neurological ROS  negative psych ROS   GI/Hepatic negative GI ROS, Neg liver ROS,   Endo/Other  negative endocrine ROS  Renal/GU negative Renal ROS  negative genitourinary   Musculoskeletal   Abdominal   Peds  Hematology negative hematology ROS (+)   Anesthesia Other Findings   Reproductive/Obstetrics negative OB ROS                             Anesthesia Physical Anesthesia Plan  ASA: 3  Anesthesia Plan: General   Post-op Pain Management:    Induction:   PONV Risk Score and Plan: Propofol infusion  Airway Management Planned:   Additional Equipment:   Intra-op Plan:   Post-operative Plan:   Informed Consent: I have reviewed the patients History and Physical, chart, labs and discussed the procedure including the risks, benefits and alternatives for the proposed anesthesia with the patient or authorized representative who has indicated his/her understanding and acceptance.     Dental Advisory Given  Plan Discussed with: CRNA  Anesthesia Plan Comments:         Anesthesia Quick Evaluation

## 2022-03-29 NOTE — Transfer of Care (Signed)
Immediate Anesthesia Transfer of Care Note  Patient: Jeff Prince  Procedure(s) Performed: COLONOSCOPY WITH PROPOFOL POLYPECTOMY INTESTINAL  Patient Location: Short Stay  Anesthesia Type:General  Level of Consciousness: awake  Airway & Oxygen Therapy: Patient Spontanous Breathing  Post-op Assessment: Report given to RN  Post vital signs: Reviewed and stable  Last Vitals:  Vitals Value Taken Time  BP 115/79 03/29/22 1323  Temp 36.7 C 03/29/22 1323  Pulse 79 03/29/22 1323  Resp 17 03/29/22 1323  SpO2 96 % 03/29/22 1323    Last Pain:  Vitals:   03/29/22 1323  TempSrc: Oral  PainSc: 0-No pain      Patients Stated Pain Goal: 5 (50/56/97 9480)  Complications: No notable events documented.

## 2022-03-29 NOTE — Discharge Instructions (Signed)
  Colonoscopy Discharge Instructions  Read the instructions outlined below and refer to this sheet in the next few weeks. These discharge instructions provide you with general information on caring for yourself after you leave the hospital. Your doctor may also give you specific instructions. While your treatment has been planned according to the most current medical practices available, unavoidable complications occasionally occur. If you have any problems or questions after discharge, call Dr. Gala Romney at [phone]. ACTIVITY You may resume your regular activity, but move at a slower pace for the next 24 hours.  Take frequent rest periods for the next 24 hours.  Walking will help get rid of the air and reduce the bloated feeling in your belly (abdomen).  No driving for 24 hours (because of the medicine (anesthesia) used during the test).   Do not sign any important legal documents or operate any machinery for 24 hours (because of the anesthesia used during the test).  NUTRITION Drink plenty of fluids.  You may resume your normal diet as instructed by your doctor.  Begin with a light meal and progress to your normal diet. Heavy or fried foods are harder to digest and may make you feel sick to your stomach (nauseated).  Avoid alcoholic beverages for 24 hours or as instructed.  MEDICATIONS You may resume your normal medications unless your doctor tells you otherwise.  WHAT YOU CAN EXPECT TODAY Some feelings of bloating in the abdomen.  Passage of more gas than usual.  Spotting of blood in your stool or on the toilet paper.  IF YOU HAD POLYPS REMOVED DURING THE COLONOSCOPY: No aspirin products for 7 days or as instructed.  No alcohol for 7 days or as instructed.  Eat a soft diet for the next 24 hours.  FINDING OUT THE RESULTS OF YOUR TEST Not all test results are available during your visit. If your test results are not back during the visit, make an appointment with your caregiver to find out the  results. Do not assume everything is normal if you have not heard from your caregiver or the medical facility. It is important for you to follow up on all of your test results.  SEEK IMMEDIATE MEDICAL ATTENTION IF: You have more than a spotting of blood in your stool.  Your belly is swollen (abdominal distention).  You are nauseated or vomiting.  You have a temperature over 101.  You have abdominal pain or discomfort that is severe or gets worse throughout the day.    2 polyps removed from your colon today  Diverticulosis, hemorrhoid and colon polyp information provided  Further recommendations to follow pending review of pathology report  At patient request, I called Hoyle Sauer at [phone] -reviewed findings and recommendations

## 2022-03-30 ENCOUNTER — Encounter: Payer: Self-pay | Admitting: Internal Medicine

## 2022-03-30 LAB — SURGICAL PATHOLOGY

## 2022-04-09 ENCOUNTER — Encounter (HOSPITAL_COMMUNITY): Payer: Self-pay | Admitting: Internal Medicine

## 2022-05-29 ENCOUNTER — Telehealth: Payer: Self-pay | Admitting: *Deleted

## 2022-05-29 NOTE — Chronic Care Management (AMB) (Signed)
  Care Coordination  Outreach Note  05/29/2022 Name: [name] MRN: [id number] DOB: 10/08/1961   Care Coordination Outreach Attempts  An unsuccessful telephone outreach was attempted today to offer the patient information about available care coordination services as a benefit of their health plan.   Follow Up Plan:  Additional outreach attempts will be made to offer the patient care coordination information and services.   Encounter Outcome:  No Answer  [place]  Direct Dial: [phone]

## 2022-06-11 NOTE — Chronic Care Management (AMB) (Signed)
  Care Coordination  Outreach Note  06/11/2022 Name: Jeff Prince MRN: [id number] DOB: April 25, 1961   Care Coordination Outreach Attempts  A second unsuccessful outreach was attempted today to offer the patient with information about available care coordination services as a benefit of their health plan.     Follow Up Plan:  Additional outreach attempts will be made to offer the patient care coordination information and services.   Encounter Outcome:  No Answer  [place]  Direct Dial: [phone]

## 2022-06-11 NOTE — Chronic Care Management (AMB) (Signed)
  Care Coordination   Note   06/11/2022 Name: BRANNEN KOPPEN MRN: [id number] DOB: [birth year]  MAALIK PINN is a 61 y.o. year old male who sees Sharilyn Sites, MD for primary care. I reached out to Will Bonnet by phone today to offer care coordination services.  Mr. Hansley was given information about Care Coordination services today including:   The Care Coordination services include support from the care team which includes your Nurse Coordinator, Clinical Social Worker, or Pharmacist.  The Care Coordination team is here to help remove barriers to the health concerns and goals most important to you. Care Coordination services are voluntary, and the patient may decline or stop services at any time by request to their care team member.   Care Coordination Consent Status: Patient agreed to services and verbal consent obtained.   Follow up plan:  Telephone appointment with care coordination team member scheduled for:  06/14/22  Encounter Outcome:  Pt. Scheduled  [place]  Direct Dial: [phone]

## 2022-06-14 ENCOUNTER — Encounter: Payer: Self-pay | Admitting: *Deleted

## 2022-06-14 ENCOUNTER — Ambulatory Visit: Payer: Self-pay | Admitting: *Deleted

## 2022-06-14 NOTE — Patient Outreach (Signed)
  Care Coordination   Initial Visit Note   06/14/2022 Name: HADRIEL NORTHUP MRN: [id number] DOB: 01-25-1961  CHALMERS IDDINGS is a 60 y.o. year old male who sees Sharilyn Sites, MD for primary care. I spoke with  Will Bonnet by phone today  What matters to the patients health and wellness today?  Overall health maintenance.  Has NP at work, sees on a regular basis for coaching.  BP is stable, range 120s/70s.  Son is  pharmacist.  Exercises daily, denies the need for additional education on chronic conditions.    Goals Addressed               This Visit's Progress     Maintain health (pt-stated)        Care Coordination Interventions: Advised patient to stay connected to resources at work, continue daily exercise regime Reviewed medications with patient and discussed notifying team if cost becomes an issue or you have questions Reviewed scheduled/upcoming provider appointments including continuing at least yearly visits with PCP         SDOH assessments and interventions completed:  Yes  SDOH Interventions Today    Flowsheet Row Most Recent Value  SDOH Interventions   Food Insecurity Interventions Intervention Not Indicated  Housing Interventions Intervention Not Indicated  Transportation Interventions Intervention Not Indicated        Care Coordination Interventions Activated:  Yes  Care Coordination Interventions:  Yes, provided   Follow up plan: No further intervention required.   Encounter Outcome:  Pt. Visit Completed   Valente David, RN, MSN, [facility] Care Coordinator [phone]

## 2022-12-28 DIAGNOSIS — S335XXA Sprain of ligaments of lumbar spine, initial encounter: Secondary | ICD-10-CM | POA: Diagnosis not present

## 2022-12-28 DIAGNOSIS — E663 Overweight: Secondary | ICD-10-CM | POA: Diagnosis not present

## 2022-12-28 DIAGNOSIS — Z6826 Body mass index (BMI) 26.0-26.9, adult: Secondary | ICD-10-CM | POA: Diagnosis not present

## 2023-01-23 ENCOUNTER — Other Ambulatory Visit: Payer: Self-pay | Admitting: Cardiovascular Disease

## 2023-02-24 NOTE — Progress Notes (Unsigned)
CARDIOLOGY OFFICE NOTE  Date:  02/28/2023    Delaine Lame Date of Birth: 1961/05/09 Medical Record #[id number]  PCP:  Assunta Found, MD  Cardiologist:  Eden Emms    No chief complaint on file.   History of Present Illness:  62 y.o.  CAD with DES to proximal LAD in 2011 Repeat cath 09/03/17 ISR and new stent to mid LAD. EF normal   CRF;s include HTN, HLD.  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 [place] with [employer]  Carries nitro with him   Had surgery on fracture /dislocation of right middle finger Dr Merlyn Lot 01/08/19 off DAT with no issues  No cardiac complaints still managing maintenance facilities Thinking of retiring this year   Has first grand child son with baby in [place]   Triglycerides up Primary put him on Vascepa   Does maintenance for [employer] out of [place] Lives in [place] Has a son there and just had his first grand daughter Work out at [employer] on elliptical and treadmill daily with no angina  Past Medical History:  Diagnosis Date   Anxiety    CAD (coronary artery disease)    11/18 cutting balloon to pLAD ISR, with DESx1 to mLAD, normal EF   Displaced fracture of proximal phalanx of right middle finger, subsequent encounter for fracture with malunion    HLD (hyperlipidemia)    HTN (hypertension)    Pneumonia 1997   S/P gallbladder OR   Sleep apnea    OSA uses CPAP nightly    Past Surgical History:  Procedure Laterality Date   BILE DUCT STENT PLACEMENT  1997   COLONOSCOPY WITH PROPOFOL N/A 03/29/2022   Procedure: COLONOSCOPY WITH PROPOFOL;  Surgeon: Corbin Ade, MD;  [facility];  Service: Endoscopy;  Laterality: N/A;  1:30pm   CORONARY ANGIOPLASTY WITH STENT PLACEMENT  2010; 09/03/2017   [facility];    CORONARY  STENT INTERVENTION N/A 09/03/2017   Procedure: CORONARY STENT INTERVENTION;  Surgeon: Corky Crafts, MD;  [facility]: [facility];  Service: Cardiovascular;  Laterality: N/A;   FLEXIBLE SIGMOIDOSCOPY     at age of 42 in [place]   LAPAROSCOPIC CHOLECYSTECTOMY  1997   with stent, RMR   LEFT HEART CATH AND CORONARY ANGIOGRAPHY N/A 09/03/2017   Procedure: LEFT HEART CATH AND CORONARY ANGIOGRAPHY;  Surgeon: Corky Crafts, MD;  Location: [facility] INVASIVE CV LAB;  Service: Cardiovascular;  Laterality: N/A;   OPEN REDUCTION INTERNAL FIXATION (ORIF) FINGER WITH RADIAL BONE GRAFT Right 01/08/2019   Procedure: OPEN REDUCTION INTERNAL FIXATION (ORIF) FINGER RIGHT;  Surgeon: Cindee Salt, MD;  Location: [facility];  Service: Orthopedics;  Laterality: Right;  AXILLARY BLOCK   POLYPECTOMY  03/29/2022   Procedure: POLYPECTOMY INTESTINAL;  Surgeon: Corbin Ade, MD;  [facility];  Service: Endoscopy;;     Medications: Current Meds  Medication Sig   ALPRAZolam (XANAX) 0.5 MG tablet Take 0.25-0.5 mg daily as needed by mouth for anxiety or sleep.    APPLE CIDER VINEGAR PO Take 1 tablet by mouth daily.   aspirin 81 MG tablet Take 81 mg by mouth daily.   Cholecalciferol (VITAMIN D) 2000 UNITS CAPS Take 5,000 Units by mouth daily.   clopidogrel (PLAVIX)  75 MG tablet TAKE (1) TABLET BY MOUTH ONCE DAILY.   Coenzyme Q10 (COQ10) 200 MG CAPS Take 200 mg daily by mouth.   fenofibrate 160 MG tablet Take 1 tablet (160 mg total) by mouth daily.   icosapent Ethyl (VASCEPA) 1 g capsule Take 2 capsules (2 g total) by mouth 2 (two) times daily.   losartan (COZAAR) 50 MG tablet TAKE ONE TABLET BY MOUTH ONCE DAILY.   metoprolol tartrate (LOPRESSOR) 25 MG tablet TAKE (1) TABLET BY MOUTH TWICE DAILY.   nitroGLYCERIN (NITROSTAT) 0.4 MG SL tablet PLACE 1 TAB UNDER TONGUE EVERY 5 MIN IF NEEDED FOR CHEST PAIN. MAY USE 3 TIMES.NO RELIEF CALL 911.   rosuvastatin (CRESTOR) 20 MG tablet Take 1  tablet (20 mg total) by mouth daily.   vitamin C (ASCORBIC ACID) 250 MG tablet Take 250 mg by mouth daily.   vitamin E 400 UNIT capsule Take 400 Units by mouth daily.     Allergies: No Known Allergies  Social History: The patient  reports that he has quit smoking. His smoking use included cigarettes. He has a 13.50 pack-year smoking history. He has never used smokeless tobacco. He reports current alcohol use of about 7.0 - 10.0 standard drinks of alcohol per week. He reports that he does not use drugs.   Family History: The patient's family history includes Cancer in his father; Diabetes in his mother; Heart attack in his paternal grandmother and paternal uncle; Hyperlipidemia in his brother, brother, and sister.   Review of Systems: Please see the history of present illness.   Otherwise, the review of systems is positive for none.   All other systems are reviewed and negative.   Physical Exam: VS:  BP (!) 140/80 (BP Location: Left Arm, Patient Position: Sitting, Cuff Size: Normal)   Pulse 63   Ht 6' (1.829 m)   Wt 193 lb (87.5 kg)   BMI 26.18 kg/m  .  BMI Body mass index is 26.18 kg/m.  Wt Readings from Last 3 Encounters:  02/28/23 193 lb (87.5 kg)  03/23/22 188 lb (85.3 kg)  03/01/22 185 lb (83.9 kg)   Affect appropriate Healthy:  appears stated age HEENT: normal Neck supple with no adenopathy JVP normal no bruits no thyromegaly Lungs clear with no wheezing and good diaphragmatic motion Heart:  S1/S2 no murmur, no rub, gallop or click PMI normal Abdomen: benighn, BS positve, no tenderness, no AAA no bruit.  No HSM or HJR Distal pulses intact with no bruits No edema Neuro non-focal Skin warm and dry No muscular weakness    Current Outpatient Medications:    ALPRAZolam (XANAX) 0.5 MG tablet, Take 0.25-0.5 mg daily as needed by mouth for anxiety or sleep. , Disp: , Rfl:    APPLE CIDER VINEGAR PO, Take 1 tablet by mouth daily., Disp: , Rfl:    aspirin 81 MG tablet,  Take 81 mg by mouth daily., Disp: , Rfl:    Cholecalciferol (VITAMIN D) 2000 UNITS CAPS, Take 5,000 Units by mouth daily., Disp: , Rfl:    clopidogrel (PLAVIX) 75 MG tablet, TAKE (1) TABLET BY MOUTH ONCE DAILY., Disp: 30 tablet, Rfl: 1   Coenzyme Q10 (COQ10) 200 MG CAPS, Take 200 mg daily by mouth., Disp: , Rfl:    fenofibrate 160 MG tablet, Take 1 tablet (160 mg total) by mouth daily., Disp: 90 tablet, Rfl: 3   icosapent Ethyl (VASCEPA) 1 g capsule, Take 2 capsules (2 g total) by mouth 2 (two) times daily., Disp: 120  capsule, Rfl: 11   losartan (COZAAR) 50 MG tablet, TAKE ONE TABLET BY MOUTH ONCE DAILY., Disp: 30 tablet, Rfl: 0   metoprolol tartrate (LOPRESSOR) 25 MG tablet, TAKE (1) TABLET BY MOUTH TWICE DAILY., Disp: 60 tablet, Rfl: 0   nitroGLYCERIN (NITROSTAT) 0.4 MG SL tablet, PLACE 1 TAB UNDER TONGUE EVERY 5 MIN IF NEEDED FOR CHEST PAIN. MAY USE 3 TIMES.NO RELIEF CALL 911., Disp: 25 tablet, Rfl: 6   rosuvastatin (CRESTOR) 20 MG tablet, Take 1 tablet (20 mg total) by mouth daily., Disp: 90 tablet, Rfl: 3   vitamin C (ASCORBIC ACID) 250 MG tablet, Take 250 mg by mouth daily., Disp: , Rfl:    vitamin E 400 UNIT capsule, Take 400 Units by mouth daily., Disp: , Rfl:   LABORATORY DATA:  EKG:  02/28/2023 SR rate 63 normal  no acute changes    Lab Results  Component Value Date   WBC 7.0 09/04/2017   HGB 13.0 09/04/2017   HCT 38.9 (L) 09/04/2017   PLT 184 09/04/2017   GLUCOSE 117 (H) 09/04/2017   NA 137 09/04/2017   K 3.7 09/04/2017   CL 106 09/04/2017   CREATININE 1.15 09/04/2017   BUN 11 09/04/2017   CO2 25 09/04/2017   TSH 1.481 Test methodology is 3rd generation TSH 03/01/2010   INR 1.0 08/30/2017    Other Studies Reviewed Today:  CORONARY STENT INTERVENTION 08/2017  LEFT HEART CATH AND CORONARY ANGIOGRAPHY  Conclusion     Prox LAD-1 lesion is 70% stenosed. FFR of LAD 0.77. Previously placed Prox LAD-2 stent has an area of proximal stent restenosis, 70%, treated with a 3.5  x 10 Wolverine. Post intervention, there is a 10% residual stenosis. Mid LAD lesion is 75% stenosed. This was treated with a 3.0 x 16 Synergy drug-eluting stent, overlapping the distal edge of the previously placed stent from 2011. A stent was successfully placed. Post intervention, there is a 0% residual stenosis. The left ventricular systolic function is normal. LV end diastolic pressure is normal. The left ventricular ejection fraction is 55-65% by visual estimate. There is no aortic valve stenosis. Unable to navigate right radial due to severe vasospasm.   Continue dual antiplatelet therapy for 12 months.  Brilinta was used since IV Cangrelor was used during the procedure.  If there is an issue with Brilinta such as cost, could switch to clopidogrel after 30 days.   Continue aggressive secondary prevention.      Assessment/Plan:  1.  CAD - ISR proximal and mid LAD 08/2017 no angina continue ASA/Plavix    2. HTN - Well controlled.  Continue current medications and low sodium Dash type diet.    3. HLD - on statin and vascepa labs with primary   4. Anxiety/panic disorder - PRN xanax   F/U in a year   Charlton Haws

## 2023-02-28 ENCOUNTER — Encounter: Payer: Self-pay | Admitting: Cardiovascular Disease

## 2023-02-28 ENCOUNTER — Ambulatory Visit: Payer: BC Managed Care – PPO | Attending: Cardiovascular Disease | Admitting: Cardiovascular Disease

## 2023-02-28 VITALS — BP 140/80 | HR 63 | Ht 72.0 in | Wt 193.0 lb

## 2023-02-28 DIAGNOSIS — I10 Essential (primary) hypertension: Secondary | ICD-10-CM | POA: Diagnosis not present

## 2023-02-28 DIAGNOSIS — I2510 Atherosclerotic heart disease of native coronary artery without angina pectoris: Secondary | ICD-10-CM

## 2023-02-28 DIAGNOSIS — E782 Mixed hyperlipidemia: Secondary | ICD-10-CM

## 2023-02-28 NOTE — Patient Instructions (Signed)
Medication Instructions:  Your physician recommends that you continue on your current medications as directed. Please refer to the Current Medication list given to you today.  *If you need a refill on your cardiac medications before your next appointment, please call your pharmacy*  Lab Work: If you have labs (blood work) drawn today and your tests are completely normal, you will receive your results only by: MyChart Message (if you have MyChart) OR A paper copy in the mail If you have any lab test that is abnormal or we need to change your treatment, we will call you to review the results.  Testing/Procedures: None ordered today.  Follow-Up: At [facility], you and your health needs are our priority.  As part of our continuing mission to provide you with exceptional heart care, we have created designated Provider Care Teams.  These Care Teams include your primary Cardiologist (physician) and Advanced Practice Providers (APPs -  Physician Assistants and Nurse Practitioners) who all work together to provide you with the care you need, when you need it.  We recommend signing up for the patient portal called "MyChart".  Sign up information is provided on this After Visit Summary.  MyChart is used to connect with patients for Virtual Visits (Telemedicine).  Patients are able to view lab/test results, encounter notes, upcoming appointments, etc.  Non-urgent messages can be sent to your provider as well.   To learn more about what you can do with MyChart, go to [email or web address].    Your next appointment:   1 year(s)  Provider:   Peter Nishan, MD      

## 2023-03-28 ENCOUNTER — Other Ambulatory Visit: Payer: Self-pay | Admitting: Cardiovascular Disease

## 2023-04-22 DIAGNOSIS — Z0000 Encounter for general adult medical examination without abnormal findings: Secondary | ICD-10-CM | POA: Diagnosis not present

## 2023-04-22 DIAGNOSIS — Z6826 Body mass index (BMI) 26.0-26.9, adult: Secondary | ICD-10-CM | POA: Diagnosis not present

## 2023-04-22 DIAGNOSIS — R7309 Other abnormal glucose: Secondary | ICD-10-CM | POA: Diagnosis not present

## 2023-04-22 DIAGNOSIS — I10 Essential (primary) hypertension: Secondary | ICD-10-CM | POA: Diagnosis not present

## 2023-04-22 DIAGNOSIS — E785 Hyperlipidemia, unspecified: Secondary | ICD-10-CM | POA: Diagnosis not present

## 2023-04-22 DIAGNOSIS — I2510 Atherosclerotic heart disease of native coronary artery without angina pectoris: Secondary | ICD-10-CM | POA: Diagnosis not present

## 2023-04-22 DIAGNOSIS — E663 Overweight: Secondary | ICD-10-CM | POA: Diagnosis not present

## 2023-04-30 DIAGNOSIS — E785 Hyperlipidemia, unspecified: Secondary | ICD-10-CM | POA: Diagnosis not present

## 2023-04-30 DIAGNOSIS — Z0000 Encounter for general adult medical examination without abnormal findings: Secondary | ICD-10-CM | POA: Diagnosis not present

## 2023-04-30 DIAGNOSIS — I10 Essential (primary) hypertension: Secondary | ICD-10-CM | POA: Diagnosis not present

## 2023-04-30 DIAGNOSIS — I2510 Atherosclerotic heart disease of native coronary artery without angina pectoris: Secondary | ICD-10-CM | POA: Diagnosis not present

## 2023-07-25 DIAGNOSIS — H5203 Hypermetropia, bilateral: Secondary | ICD-10-CM | POA: Diagnosis not present

## 2023-09-12 DIAGNOSIS — E663 Overweight: Secondary | ICD-10-CM | POA: Diagnosis not present

## 2023-09-12 DIAGNOSIS — E785 Hyperlipidemia, unspecified: Secondary | ICD-10-CM | POA: Diagnosis not present

## 2023-09-12 DIAGNOSIS — E781 Pure hyperglyceridemia: Secondary | ICD-10-CM | POA: Diagnosis not present

## 2023-09-12 DIAGNOSIS — I2510 Atherosclerotic heart disease of native coronary artery without angina pectoris: Secondary | ICD-10-CM | POA: Diagnosis not present

## 2023-09-12 DIAGNOSIS — Z6827 Body mass index (BMI) 27.0-27.9, adult: Secondary | ICD-10-CM | POA: Diagnosis not present

## 2023-09-12 DIAGNOSIS — I10 Essential (primary) hypertension: Secondary | ICD-10-CM | POA: Diagnosis not present

## 2023-09-17 DIAGNOSIS — E785 Hyperlipidemia, unspecified: Secondary | ICD-10-CM | POA: Diagnosis not present

## 2023-09-18 LAB — LAB REPORT - SCANNED: EGFR: 70

## 2023-11-11 DIAGNOSIS — L820 Inflamed seborrheic keratosis: Secondary | ICD-10-CM | POA: Diagnosis not present

## 2023-11-11 DIAGNOSIS — L821 Other seborrheic keratosis: Secondary | ICD-10-CM | POA: Diagnosis not present

## 2023-11-11 DIAGNOSIS — D225 Melanocytic nevi of trunk: Secondary | ICD-10-CM | POA: Diagnosis not present

## 2023-11-11 DIAGNOSIS — L918 Other hypertrophic disorders of the skin: Secondary | ICD-10-CM | POA: Diagnosis not present

## 2023-11-11 DIAGNOSIS — L570 Actinic keratosis: Secondary | ICD-10-CM | POA: Diagnosis not present

## 2023-11-11 DIAGNOSIS — D2262 Melanocytic nevi of left upper limb, including shoulder: Secondary | ICD-10-CM | POA: Diagnosis not present

## 2024-03-20 NOTE — Progress Notes (Signed)
 CARDIOLOGY OFFICE NOTE  Date:  04/03/2024    Jeff Prince Date of Birth: [birth year] Medical Record #[id number]  PCP:  [name], MD  Cardiologist:  [name]     History of Present Illness:  63 y.o.  CAD with DES to proximal LAD in 2011 Repeat cath 09/03/17 ISR and new stent to mid LAD. EF normal   CRF;s include HTN, HLD.  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 [place] Lives in [place] Has a son there and has onet grand daughter Work out at [employer] on elliptical and treadmill daily with no angina  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  Past Medical History:  Diagnosis Date   Anxiety    CAD (coronary artery disease)    11/18 cutting balloon to pLAD ISR, with DESx1 to mLAD, normal EF   Displaced fracture of proximal phalanx of right middle finger, subsequent encounter for fracture with malunion    HLD (hyperlipidemia)    HTN (hypertension)    Pneumonia 1997   S/P gallbladder OR   Sleep apnea    OSA uses CPAP nightly    Past Surgical History:  Procedure Laterality Date   BILE DUCT STENT PLACEMENT  1997   COLONOSCOPY WITH PROPOFOL  N/A 03/29/2022   Procedure: COLONOSCOPY WITH PROPOFOL ;  Surgeon: Suzette Espy, MD;  [facility];  Service: Endoscopy;  Laterality: N/A;  1:30pm   CORONARY ANGIOPLASTY WITH STENT PLACEMENT  2010; 09/03/2017   [facility];    CORONARY STENT INTERVENTION N/A 09/03/2017   Procedure: CORONARY STENT INTERVENTION;   Surgeon: Lucendia Rusk, MD;  [facility];  Service: Cardiovascular;  Laterality: N/A;   FLEXIBLE SIGMOIDOSCOPY     at age of 33 in [place]   LAPAROSCOPIC CHOLECYSTECTOMY  1997   with stent, RMR   LEFT HEART CATH AND CORONARY ANGIOGRAPHY N/A 09/03/2017   Procedure: LEFT HEART CATH AND CORONARY ANGIOGRAPHY;  Surgeon: Lucendia Rusk, MD;  Location: [facility] INVASIVE CV LAB;  Service: Cardiovascular;  Laterality: N/A;   OPEN REDUCTION INTERNAL FIXATION (ORIF) FINGER WITH RADIAL BONE GRAFT Right 01/08/2019   Procedure: OPEN REDUCTION INTERNAL FIXATION (ORIF) FINGER RIGHT;  Surgeon: Lyanne Sample, MD;  Location: [facility];  Service: Orthopedics;  Laterality: Right;  AXILLARY BLOCK   POLYPECTOMY  03/29/2022   Procedure: POLYPECTOMY INTESTINAL;  Surgeon: Suzette Espy, MD;  [facility];  Service: Endoscopy;;     Medications: Current Meds  Medication Sig   ALPRAZolam  (XANAX ) 0.5 MG tablet Take 0.25-0.5 mg daily as needed by mouth for anxiety or sleep.    APPLE CIDER VINEGAR PO Take 1 tablet by mouth daily.   aspirin  81 MG tablet Take 81 mg by mouth daily.   Cholecalciferol (VITAMIN D) 2000 UNITS CAPS Take 5,000 Units by mouth daily.   clopidogrel  (PLAVIX ) 75 MG tablet TAKE (1) TABLET BY MOUTH ONCE DAILY.   Coenzyme Q10 (  COQ10) 200 MG CAPS Take 200 mg daily by mouth.   fenofibrate  160 MG tablet Take 1 tablet (160 mg total) by mouth daily.   icosapent  Ethyl (VASCEPA ) 1 g capsule Take 2 capsules (2 g total) by mouth 2 (two) times daily.   losartan  (COZAAR ) 50 MG tablet TAKE ONE TABLET BY MOUTH ONCE DAILY.   MAGNESIUM GLUCONATE PO Take 500 tablets by mouth daily at 6 (six) AM.   metoprolol  tartrate (LOPRESSOR ) 25 MG tablet TAKE (1) TABLET BY MOUTH TWICE DAILY.   nitroGLYCERIN  (NITROSTAT ) 0.4 MG SL tablet PLACE 1 TAB UNDER TONGUE EVERY 5 MIN IF NEEDED FOR CHEST PAIN. MAY USE 3 TIMES.NO RELIEF CALL 911.   rosuvastatin  (CRESTOR ) 20 MG tablet Take 1  tablet (20 mg total) by mouth daily.   vitamin C (ASCORBIC ACID) 250 MG tablet Take 250 mg by mouth daily.   vitamin E 400 UNIT capsule Take 400 Units by mouth daily.     Allergies: No Known Allergies  Social History: The patient  reports that he has quit smoking. His smoking use included cigarettes. He has a 13.5 pack-year smoking history. He has never used smokeless tobacco. He reports current alcohol use of about 7.0 - 10.0 standard drinks of alcohol per week. He reports that he does not use drugs.   Family History: The patient's family history includes Cancer in his father; Diabetes in his mother; Heart attack in his paternal grandmother and paternal uncle; Hyperlipidemia in his brother, brother, and sister.   Review of Systems: Please see the history of present illness.   Otherwise, the review of systems is positive for none.   All other systems are reviewed and negative.   Physical Exam: VS:  BP 130/76   Pulse 69   Ht 6' (1.829 m)   Wt 190 lb (86.2 kg)   SpO2 98%   BMI 25.77 kg/m  .  BMI Body mass index is 25.77 kg/m.  Wt Readings from Last 3 Encounters:  04/03/24 190 lb (86.2 kg)  02/28/23 193 lb (87.5 kg)  03/23/22 188 lb (85.3 kg)   Affect appropriate Healthy:  appears stated age HEENT: normal Neck supple with no adenopathy JVP normal no bruits no thyromegaly Lungs clear with no wheezing and good diaphragmatic motion Heart:  S1/S2 no murmur, no rub, gallop or click PMI normal Abdomen: benighn, BS positve, no tenderness, no AAA no bruit.  No HSM or HJR Distal pulses intact with no bruits No edema Neuro non-focal Skin warm and dry No muscular weakness    Current Outpatient Medications:    ALPRAZolam  (XANAX ) 0.5 MG tablet, Take 0.25-0.5 mg daily as needed by mouth for anxiety or sleep. , Disp: , Rfl:    APPLE CIDER VINEGAR PO, Take 1 tablet by mouth daily., Disp: , Rfl:    aspirin  81 MG tablet, Take 81 mg by mouth daily., Disp: , Rfl:    Cholecalciferol  (VITAMIN D) 2000 UNITS CAPS, Take 5,000 Units by mouth daily., Disp: , Rfl:    clopidogrel  (PLAVIX ) 75 MG tablet, TAKE (1) TABLET BY MOUTH ONCE DAILY., Disp: 90 tablet, Rfl: 3   Coenzyme Q10 (COQ10) 200 MG CAPS, Take 200 mg daily by mouth., Disp: , Rfl:    fenofibrate  160 MG tablet, Take 1 tablet (160 mg total) by mouth daily., Disp: 90 tablet, Rfl: 3   icosapent  Ethyl (VASCEPA ) 1 g capsule, Take 2 capsules (2 g total) by mouth 2 (two) times daily., Disp: 120 capsule, Rfl: 11   losartan  (  COZAAR ) 50 MG tablet, TAKE ONE TABLET BY MOUTH ONCE DAILY., Disp: 90 tablet, Rfl: 3   MAGNESIUM GLUCONATE PO, Take 500 tablets by mouth daily at 6 (six) AM., Disp: , Rfl:    metoprolol  tartrate (LOPRESSOR ) 25 MG tablet, TAKE (1) TABLET BY MOUTH TWICE DAILY., Disp: 180 tablet, Rfl: 3   nitroGLYCERIN  (NITROSTAT ) 0.4 MG SL tablet, PLACE 1 TAB UNDER TONGUE EVERY 5 MIN IF NEEDED FOR CHEST PAIN. MAY USE 3 TIMES.NO RELIEF CALL 911., Disp: 25 tablet, Rfl: 6   rosuvastatin  (CRESTOR ) 20 MG tablet, Take 1 tablet (20 mg total) by mouth daily., Disp: 90 tablet, Rfl: 3   vitamin C (ASCORBIC ACID) 250 MG tablet, Take 250 mg by mouth daily., Disp: , Rfl:    vitamin E 400 UNIT capsule, Take 400 Units by mouth daily., Disp: , Rfl:   LABORATORY DATA:  EKG:  04/03/2024 SR rate 63 normal ST PVC no changes   Lab Results  Component Value Date   WBC 7.0 09/04/2017   HGB 13.0 09/04/2017   HCT 38.9 (L) 09/04/2017   PLT 184 09/04/2017   GLUCOSE 117 (H) 09/04/2017   NA 137 09/04/2017   K 3.7 09/04/2017   CL 106 09/04/2017   CREATININE 1.15 09/04/2017   BUN 11 09/04/2017   CO2 25 09/04/2017   TSH 1.481 Test methodology is 3rd generation TSH 03/01/2010   INR 1.0 08/30/2017    Other Studies Reviewed Today:  CORONARY STENT INTERVENTION 08/2017  LEFT HEART CATH AND CORONARY ANGIOGRAPHY  Conclusion     Prox LAD-1 lesion is 70% stenosed. FFR of LAD 0.77. Previously placed Prox LAD-2 stent has an area of proximal stent  restenosis, 70%, treated with a 3.5 x 10 Wolverine. Post intervention, there is a 10% residual stenosis. Mid LAD lesion is 75% stenosed. This was treated with a 3.0 x 16 Synergy drug-eluting stent, overlapping the distal edge of the previously placed stent from 2011. A stent was successfully placed. Post intervention, there is a 0% residual stenosis. The left ventricular systolic function is normal. LV end diastolic pressure is normal. The left ventricular ejection fraction is 55-65% by visual estimate. There is no aortic valve stenosis. Unable to navigate right radial due to severe vasospasm.   Continue dual antiplatelet therapy for 12 months.  Brilinta  was used since IV Cangrelor  was used during the procedure.  If there is an issue with Brilinta  such as cost, could switch to clopidogrel  after 30 days.   Continue aggressive secondary prevention.      Assessment/Plan:  1.  CAD - ISR proximal and mid LAD 08/2017 no angina continue ASA/Plavix    ETT ordered   2. HTN - Well controlled.  Continue current medications and low sodium Dash type diet.    3. HLD - on statin and vascepa  labs with primary Sent note to Dr Maximo Spar to see if there are any triglyceride trials as fenobrate and vascepa  not helping much LDL is fine 72 but TG still > 500 with good diet   4. Anxiety/panic disorder - PRN xanax    ETT for CAD distant stent  F/U in a year   Jeff Prince

## 2024-04-03 ENCOUNTER — Ambulatory Visit: Payer: BC Managed Care – PPO | Attending: Cardiovascular Disease | Admitting: Cardiovascular Disease

## 2024-04-03 ENCOUNTER — Encounter: Payer: Self-pay | Admitting: Cardiovascular Disease

## 2024-04-03 VITALS — BP 130/76 | HR 69 | Ht 72.0 in | Wt 190.0 lb

## 2024-04-03 DIAGNOSIS — I2510 Atherosclerotic heart disease of native coronary artery without angina pectoris: Secondary | ICD-10-CM | POA: Diagnosis not present

## 2024-04-03 DIAGNOSIS — I10 Essential (primary) hypertension: Secondary | ICD-10-CM

## 2024-04-03 DIAGNOSIS — E782 Mixed hyperlipidemia: Secondary | ICD-10-CM | POA: Diagnosis not present

## 2024-04-03 NOTE — Patient Instructions (Signed)
 Medication Instructions:  Your physician recommends that you continue on your current medications as directed. Please refer to the Current Medication list given to you today.  *If you need a refill on your cardiac medications before your next appointment, please call your pharmacy*  Testing/Procedures: Your physician has requested that you have an exercise tolerance test. For further information please visit [email or web address] Please also follow instruction sheet, as given.  Follow-Up: At [facility], you and your health needs are our priority.  As part of our continuing mission to provide you with exceptional heart care, our providers are all part of one team.  This team includes your primary Cardiologist (physician) and Advanced Practice Providers or APPs (Physician Assistants and Nurse Practitioners) who all work together to provide you with the care you need, when you need it.  Your next appointment:   1 year(s)  Provider:   Janelle Mediate, MD   We recommend signing up for the patient portal called "MyChart".  Sign up information is provided on this After Visit Summary.  MyChart is used to connect with patients for Virtual Visits (Telemedicine).  Patients are able to view lab/test results, encounter notes, upcoming appointments, etc.  Non-urgent messages can be sent to your provider as well.   To learn more about what you can do with MyChart, go to [email or web address].   Other Instructions Exercise Tolerance Test  Please arrive 15 minutes prior to your appointment time for registration and insurance purposes.  The test will take approximately 45 minutes to complete.  How to prepare for your Exercise Stress Test: Do bring a list of your current medications with you.  If not listed below, you may take your medications as normal. Do wear comfortable clothes (no dresses or overalls) and walking shoes, tennis shoes preferred (no heels or open toed shoes are allowed) Do Not  wear cologne, perfume, aftershave or lotions (deodorant is allowed). Please report to [address], [place], [place] [address] for your test.  If these instructions are not followed, your test will have to be rescheduled.  If you have questions or concerns about your appointment, you can call the Stress Lab at [phone].  If you cannot keep your appointment, please provide 24 hours notification to the Stress Lab, to avoid a possible $50 charge to your account.

## 2024-04-29 ENCOUNTER — Encounter (HOSPITAL_COMMUNITY): Payer: Self-pay | Admitting: *Deleted

## 2024-04-29 ENCOUNTER — Telehealth (HOSPITAL_COMMUNITY): Payer: Self-pay | Admitting: *Deleted

## 2024-04-29 NOTE — Telephone Encounter (Signed)
 Reminder letter with instructions for upcoming GXT on 05/08/24 sent via [employer].

## 2024-05-08 ENCOUNTER — Ambulatory Visit (HOSPITAL_COMMUNITY)
Admission: RE | Admit: 2024-05-08 | Discharge: 2024-05-08 | Disposition: A | Discharge status: Home / Self Care | Source: Ambulatory Visit | Attending: Cardiovascular Disease | Admitting: Cardiovascular Disease

## 2024-05-08 ENCOUNTER — Ambulatory Visit: Payer: Self-pay | Admitting: Cardiovascular Disease

## 2024-05-08 DIAGNOSIS — I2510 Atherosclerotic heart disease of native coronary artery without angina pectoris: Secondary | ICD-10-CM | POA: Insufficient documentation

## 2024-05-08 LAB — EXERCISE TOLERANCE TEST
Angina Index: 0
Duke Treadmill Score: 10
Estimated workload: 11.7
Exercise duration (min): 10 min
Exercise duration (sec): 0 s
MPHR: 157 {beats}/min
Peak HR: 148 {beats}/min
Percent HR: 94 %
RPE: 17
Rest HR: 66 {beats}/min
ST Depression (mm): 0 mm

## 2024-06-10 LAB — LAB REPORT - SCANNED
EGFR: 71
EGFR: 71
PSA, Total: 5.7

## 2024-07-30 DIAGNOSIS — H5203 Hypermetropia, bilateral: Secondary | ICD-10-CM | POA: Diagnosis not present

## 2024-08-31 DIAGNOSIS — K7689 Other specified diseases of liver: Secondary | ICD-10-CM | POA: Diagnosis not present

## 2024-08-31 DIAGNOSIS — I2510 Atherosclerotic heart disease of native coronary artery without angina pectoris: Secondary | ICD-10-CM | POA: Diagnosis not present

## 2024-08-31 DIAGNOSIS — I10 Essential (primary) hypertension: Secondary | ICD-10-CM | POA: Diagnosis not present

## 2024-08-31 DIAGNOSIS — E785 Hyperlipidemia, unspecified: Secondary | ICD-10-CM | POA: Diagnosis not present
